# Patient Record
Sex: Female | Born: 1965
Health system: Southern US, Community
[De-identification: ages and names within clinical notes are randomized; demographics above are authoritative.]

## PROBLEM LIST (undated history)

## (undated) DIAGNOSIS — M1990 Unspecified osteoarthritis, unspecified site: Secondary | ICD-10-CM

## (undated) DIAGNOSIS — F329 Major depressive disorder, single episode, unspecified: Secondary | ICD-10-CM

## (undated) DIAGNOSIS — F32A Depression, unspecified: Secondary | ICD-10-CM

## (undated) HISTORY — DX: Major depressive disorder, single episode, unspecified: F32.9

## (undated) HISTORY — DX: Depression, unspecified: F32.A

## (undated) HISTORY — PX: OOPHORECTOMY: SHX86

## (undated) HISTORY — DX: Unspecified osteoarthritis, unspecified site: M19.90

---

## 1984-12-28 HISTORY — PX: APPENDECTOMY: SHX54

## 1988-12-28 HISTORY — PX: SEPTOPLASTY: SUR1290

## 1995-12-29 HISTORY — PX: CARPAL TUNNEL RELEASE: SHX101

## 1999-05-06 ENCOUNTER — Other Ambulatory Visit: Admission: RE | Admit: 1999-05-06 | Discharge: 1999-05-06 | Payer: Self-pay | Admitting: Gynecology

## 2000-11-08 ENCOUNTER — Other Ambulatory Visit: Admission: RE | Admit: 2000-11-08 | Discharge: 2000-11-08 | Payer: Self-pay | Admitting: Gynecology

## 2001-07-14 ENCOUNTER — Other Ambulatory Visit: Admission: RE | Admit: 2001-07-14 | Discharge: 2001-07-14 | Payer: Self-pay | Admitting: *Deleted

## 2002-01-26 ENCOUNTER — Inpatient Hospital Stay (HOSPITAL_COMMUNITY): Admission: AD | Admit: 2002-01-26 | Discharge: 2002-01-28 | Payer: Self-pay | Admitting: *Deleted

## 2002-03-14 ENCOUNTER — Other Ambulatory Visit: Admission: RE | Admit: 2002-03-14 | Discharge: 2002-03-14 | Payer: Self-pay | Admitting: *Deleted

## 2003-04-20 ENCOUNTER — Other Ambulatory Visit: Admission: RE | Admit: 2003-04-20 | Discharge: 2003-04-20 | Payer: Self-pay | Admitting: Gynecology

## 2004-03-03 ENCOUNTER — Ambulatory Visit (HOSPITAL_COMMUNITY): Admission: RE | Admit: 2004-03-03 | Discharge: 2004-03-03 | Payer: Self-pay | Admitting: Gastroenterology

## 2004-09-04 ENCOUNTER — Other Ambulatory Visit: Admission: RE | Admit: 2004-09-04 | Discharge: 2004-09-04 | Payer: Self-pay | Admitting: Gynecology

## 2004-12-28 HISTORY — PX: CHOLECYSTECTOMY: SHX55

## 2005-02-17 ENCOUNTER — Emergency Department (HOSPITAL_COMMUNITY): Admission: EM | Admit: 2005-02-17 | Discharge: 2005-02-18 | Payer: Self-pay | Admitting: Emergency Medicine

## 2005-02-21 ENCOUNTER — Observation Stay (HOSPITAL_COMMUNITY): Admission: RE | Admit: 2005-02-21 | Discharge: 2005-02-22 | Payer: Self-pay | Admitting: *Deleted

## 2005-02-22 ENCOUNTER — Encounter (INDEPENDENT_AMBULATORY_CARE_PROVIDER_SITE_OTHER): Payer: Self-pay | Admitting: Specialist

## 2005-10-12 ENCOUNTER — Other Ambulatory Visit: Admission: RE | Admit: 2005-10-12 | Discharge: 2005-10-12 | Payer: Self-pay | Admitting: Gynecology

## 2005-10-28 ENCOUNTER — Encounter (INDEPENDENT_AMBULATORY_CARE_PROVIDER_SITE_OTHER): Payer: Self-pay | Admitting: *Deleted

## 2005-10-28 ENCOUNTER — Ambulatory Visit (HOSPITAL_BASED_OUTPATIENT_CLINIC_OR_DEPARTMENT_OTHER): Admission: RE | Admit: 2005-10-28 | Discharge: 2005-10-28 | Payer: Self-pay | Admitting: Gynecology

## 2005-10-28 HISTORY — PX: OTHER SURGICAL HISTORY: SHX169

## 2005-10-28 HISTORY — PX: TUBAL LIGATION: SHX77

## 2005-12-28 HISTORY — PX: ABDOMINAL HYSTERECTOMY: SHX81

## 2005-12-28 HISTORY — PX: OTHER SURGICAL HISTORY: SHX169

## 2006-06-17 ENCOUNTER — Ambulatory Visit (HOSPITAL_COMMUNITY): Admission: RE | Admit: 2006-06-17 | Discharge: 2006-06-17 | Payer: Self-pay | Admitting: Gynecology

## 2006-06-22 ENCOUNTER — Ambulatory Visit: Admission: RE | Admit: 2006-06-22 | Discharge: 2006-06-22 | Payer: Self-pay | Admitting: Gynecologic Oncology

## 2006-06-22 ENCOUNTER — Other Ambulatory Visit: Admission: RE | Admit: 2006-06-22 | Discharge: 2006-06-22 | Payer: Self-pay | Admitting: Gynecology

## 2006-07-06 ENCOUNTER — Inpatient Hospital Stay (HOSPITAL_COMMUNITY): Admission: RE | Admit: 2006-07-06 | Discharge: 2006-07-08 | Payer: Self-pay | Admitting: Gynecology

## 2006-07-06 ENCOUNTER — Encounter (INDEPENDENT_AMBULATORY_CARE_PROVIDER_SITE_OTHER): Payer: Self-pay | Admitting: *Deleted

## 2007-08-11 ENCOUNTER — Other Ambulatory Visit: Admission: RE | Admit: 2007-08-11 | Discharge: 2007-08-11 | Payer: Self-pay | Admitting: Gynecology

## 2008-08-13 ENCOUNTER — Other Ambulatory Visit: Admission: RE | Admit: 2008-08-13 | Discharge: 2008-08-13 | Payer: Self-pay | Admitting: Gynecology

## 2008-08-31 ENCOUNTER — Ambulatory Visit: Payer: Self-pay | Admitting: Emergency Medicine

## 2008-08-31 ENCOUNTER — Emergency Department (HOSPITAL_COMMUNITY): Admission: EM | Admit: 2008-08-31 | Discharge: 2008-08-31 | Payer: Self-pay | Admitting: Emergency Medicine

## 2009-08-29 ENCOUNTER — Encounter: Payer: Self-pay | Admitting: Women's Health

## 2009-08-29 ENCOUNTER — Ambulatory Visit: Payer: Self-pay | Admitting: Women's Health

## 2009-08-29 ENCOUNTER — Other Ambulatory Visit: Admission: RE | Admit: 2009-08-29 | Discharge: 2009-08-29 | Payer: Self-pay | Admitting: Gynecology

## 2010-03-10 ENCOUNTER — Encounter: Admission: RE | Admit: 2010-03-10 | Discharge: 2010-03-10 | Payer: Self-pay | Admitting: Family Medicine

## 2010-09-04 ENCOUNTER — Other Ambulatory Visit: Admission: RE | Admit: 2010-09-04 | Discharge: 2010-09-04 | Payer: Self-pay | Admitting: Gynecology

## 2010-09-04 ENCOUNTER — Ambulatory Visit: Payer: Self-pay | Admitting: Women's Health

## 2011-05-12 NOTE — Op Note (Signed)
NAMEZEINAB, RODWELL               ACCOUNT NO.:  [id number]   MEDICAL RECORD NO.:  [id number]          PATIENT TYPE:  EMS   LOCATION:  MAJO                         FACILITY:  [facility]   PHYSICIAN:  Leslye Peer, MD    DATE OF BIRTH:  Jan 18, 1966   DATE OF PROCEDURE:  08/31/2008  DATE OF DISCHARGE:  08/31/2008                               OPERATIVE REPORT   PROCEDURE:  Fiberoptic bronchoscopy with foreign body retrieval.   OPERATOR:  Leslye Peer, MD   INDICATION:  Aspiration of pill.   Consent was obtained from the patient and a signed copy is in her  hospital chart.   MEDICATIONS GIVEN:  1. Fentanyl 100 mcg IV in divided doses.  2. Versed 4 mg IV in divided doses.  3. Lidocaine 1% to the bronchoalveolar tree approximately 15 mL total.   PROCEDURE DETAILS:  After informed consent was obtained, conscious  sedation was initiated as outlined above.  The bronchoscope was  introduced to the left nare without difficulty.  The upper airway and  posterior pharynx were normal in appearance.  There was no foreign body  noted.  Her cords moved normally with inspiration and phonation.  Trachea was intubated and local anesthesia was achieved with 1%  lidocaine.  The trachea was normal in appearance.  The main carina was  sharp.  The entire left-sided exam was normal without any evidence of  foreign body or abnormal secretions or erythema.  The left upper lobe  lingular and left lower lobe bronchi were all inspected.  Attention was  then turned to the right side exam, a large white friable pill was noted  in the bronchus intermedius.  The right upper lobe bronchus was clear.  The pill on the bronchus intermedius was easily movable with suctioning.  I was able to retrieve it by simple suctioning after 2-3 trials.  It was  retrieved all the way into the patient's mouth and then suctioned away.  Inspection of the bronchus intermedius as well as the right middle lobe  and lower lobe bronchi  showed no further evidence of foreign body  material.  There was significant erythema and inflammation present.   SPECIMENS:  None.   COMPLICATIONS:  None.   ESTIMATED BLOOD LOSS:  None.   PLANS:  I do not believe that Ms. Kernodle needs prophylactic antibiotics,  but I have asked her to call me if she develops any fevers, chills,  purulent sputum, or other bothersome symptoms.  I will give her a  prescription for Robitussin A-C for cough suppression.  She can follow  up with me on an as-needed basis.      Leslye Peer, MD  Electronically Signed     RSB/MEDQ  D:  08/31/2008  T:  09/01/2008  Job:  [id number]

## 2011-05-12 NOTE — Consult Note (Signed)
[name], PANGILINAN               ACCOUNT NO.:  [id number]   MEDICAL RECORD NO.:  [id number]          PATIENT TYPE:  EMS   LOCATION:  MAJO                         FACILITY:  [facility]   PHYSICIAN:  Leslye Peer, MD    DATE OF BIRTH:  [birth year]   DATE OF CONSULTATION:  08/31/2008  DATE OF DISCHARGE:  08/31/2008                                 CONSULTATION   CHIEF COMPLAINT:  Aspiration of pills.   HISTORY:  Angie [name] is Angie very pleasant 45 year old [name] without any  significant past medical history.  She has never smoked.  She takes  Zoloft for depression.  She states that she was well until this morning  when she got choked while she was taking 3 of her vitamins that included  vitamin D and calcium with vitamin D.  She could not move air and her  husband performed an abdominal thrust which dislodged the pills, so that  she could breathe again.  She believes that she swallowed at least some  of the material successfully, but she also feels Angie foreign body  sensation.  She has had Angie paroxysmal cough with focal right upper chest  burning pain since this episode.  Her chest x-ray in the emergency  department shows no evident foreign body.  Pulmonary was asked to  evaluate her in the emergency department.   PAST MEDICAL HISTORY:  Depression and there is no history of asthma as Angie  child.   MEDICATIONS:  Zoloft 100 mg daily.   ALLERGIES:  No known drug allergies.   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  She is Angie never smoker.  She does not use alcohol.  She denies any significant breathing exposures.  She is able to exercise  and exert herself without any difficulty at baseline.   FAMILY HISTORY:  Noncontributory.   PHYSICAL EXAMINATION:  VITAL SIGNS:  Temperature 98.5, blood pressure  122/75, heart rate 77, respiratory rate 16, and SPO2 96% on room air.  GENERAL:  This is Angie pleasant [name], slightly overweight who is  comfortable on oxygen but she is coughing frequently.  HEENT:  The oropharynx is clear.  I did not see Angie foreign body in her  mouth.  There is no stridor.  LUNGS:  Clear bilaterally.  She does cough on any deep inspiration.  HEART:  Regular without murmur.  ABDOMEN:  Benign.  EXTREMITIES:  No cyanosis, clubbing, or edema.  NEUROLOGIC:  He is alert and oriented x3 with Angie nonfocal exam.   Chest x-ray is clear as indicated above.   LABORATORY DATA:  None.   IMPRESSION:  Angie [name] status post aspiration of  pills with Angie possible residual foreign body.  Angie CT scan of her chest has  been ordered.  I believe that she likely needs fiberoptic bronchoscopy  whether an  endobronchial foreign body is seen on her CT or not.  I will try to  arrange for bronchoscopy to be performed in the endoscopy suite, but  will consider doing this in the  emergency department if the space is not  available.  I will consider discontinuation of her CT scan of the chest  in order to facilitate bronchoscopy if this is necessary.      Leslye Peer, MD  Electronically Signed     RSB/MEDQ  D:  08/31/2008  T:  09/01/2008  Job:  [id number]

## 2011-05-15 NOTE — H&P (Signed)
Angie [name], Angie [name]               ACCOUNT NO.:  [id number]   MEDICAL RECORD NO.:  [id number]          PATIENT TYPE:  AMB   LOCATION:  NESC                         FACILITY:  [facility]   PHYSICIAN:  Juan H. Lily Peer, M.D.DATE OF BIRTH:  07-29-1966   DATE OF ADMISSION:  DATE OF DISCHARGE:                                HISTORY & PHYSICAL   CHIEF COMPLAINT:  1.  Menometrorrhagia.  2.  Endometrial polyp.  3.  Request for elective permanent sterilization.   HISTORY:  The patient is a 45 year old gravida 2 para 1 AB 1 who has been  evaluated at [facility] for menometrorrhagia. The patient has  been having two periods per month over the past 3-4 months. She underwent an  endometrial biopsy on September 22, 2005, which demonstrated secretory  endometrium, no hyperplasia or malignancy identified. She underwent a  sonohysterogram which demonstrated a left anterior wall defect measuring 11  x 16 x 14 mm consistent with endometrial polyp. Her recent Pap smear also  had been normal, as well as her CBC, urinalysis, Pap smear, and  comprehensive metabolic panel. The patient also had requested to proceed  with tubal sterilization procedure for which the patient has been counseled  for both.   PAST MEDICAL HISTORY:  The patient denies any allergies. She has history of  depression for which she takes Effexor 150 mg daily. She had been on Megace  20 mg two weeks before her planned surgery. She also takes multivitamin,  Caltrate + D twice a day, baby aspirin daily.   PREVIOUS SURGERIES:  Consist of appendectomy in 1986, in 1990 she had a  septoplasty, in 1997 she had carpal tunnel release, and cholecystectomy in  February 2006.   FAMILY HISTORY:  History of hypertension in maternal grandmother, as well as  family history of hypercholesterolemia and father with insulin-dependent  diabetes.   PHYSICAL EXAMINATION:  VITAL SIGNS:  The patient weighs 217 pounds. She is 5  feet 6-and-a-half  inches tall. Blood pressure 112/60.  HEENT:  Unremarkable.  NECK:  Supple, trachea midline. No carotid bruits, no thyromegaly.  LUNGS:  Clear to auscultation without rhonchi or wheezes.  HEART:  Regular rate and rhythm. No murmurs or gallop.  BREAST:  Exam done at the time of her annual exam on October 16 was normal.  PELVIC:  Bartholin, urethra, Skene glands within normal limits. Vagina and  cervix with no lesion or discharge. Uterus anteverted, normal size, shape,  and consistency. Adnexa without masses or tenderness.  RECTAL:  Deferred.   ASSESSMENT:  A 45 year old gravida 2 para 1 abortus 1 with menometrorrhagia  consisting of two periods per month over the past 3-4 months. Evaluation has  consisted of a benign endometrial biopsy as well as a sonohysterogram  demonstrating endometrium polyp. The patient requesting also at the time of  resectoscopic polypectomy to have a laparoscopic tubal sterilization  procedure. The patient is fully aware the procedure is permanent, she will  not be able to have any more children. Potential risks have been outlined in  the preoperative consultation in the office to include trauma to  internal  organs with a laparoscope requiring open laparotomy or in the event of  technical difficulty, open laparotomy which will require the patient to be  in the hospital an additional few days. Also, trauma to internal organs  resulting in hemorrhage and the potential for transfusion or blood products  with potential risk of anaphylactic reactions, hepatitis, and AIDS were  discussed. Also, increased risk for deep venous thrombosis and pulmonary  embolism and death were also discussed. Also the risk of perforation to  uterus was also discussed. All these issues were discussed with the patient,  all questions are answered, and will follow accordingly.   PLAN:  The patient is scheduled for (1) resectoscopic polypectomy, (2)  laparoscopic tubal sterilization procedure  at [facility] on  Wednesday, October 28, 2005, at 1 p.m.      Juan H. Lily Peer, M.D.  Electronically Signed     JHF/MEDQ  D:  10/28/2005  T:  10/28/2005  Job:  [id number]

## 2011-05-15 NOTE — Op Note (Signed)
Angie [name], Angie [name]               ACCOUNT NO.:  [id number]   MEDICAL RECORD NO.:  [id number]          PATIENT TYPE:  INP   LOCATION:  0006                         FACILITY:  [facility]   PHYSICIAN:  Juan H. Lily Peer, M.D.DATE OF BIRTH:  06-08-1966   DATE OF PROCEDURE:  07/06/2006  DATE OF DISCHARGE:                                 OPERATIVE REPORT   SURGEON:  Juan H. Lily Peer, M.D.; Patience Musca, M.D.   INDICATIONS FOR OPERATION:  The patient is a 45 year old gravida 4, para 1,  AB 3 with right pelvic mass and pain.   PREOPERATIVE DIAGNOSIS:  Symptomatic pelvic mass.   POSTOPERATIVE DIAGNOSIS:  Right endometriotic cyst.   ANESTHESIA:  General endotracheal anesthesia.   PROCEDURE PERFORMED:  1.  Exploratory laparotomy.  2.  Pelvic adhesiolysis.  3.  Total abdominal hysterectomy with right salpingo-oophorectomy.   FINDINGS:  A 6 x 7 cm right ovarian mass encroached in the cul-de-sac  encased in adhesions, left tube and ovary were normal.  There was evidence  of previous tubal sterilization procedure, whereby Hulka clips were noted in  the proximal one third portion of both fallopian tubes.  Uterine serosa was  normal.  There was no excrescences on the surface of the right ovary and  left ovary appeared normal.  There was no peritoneal implants or any seeding  of the peritoneum on inspection.   DESCRIPTION OF OPERATION:  After the patient was adequately counseled, she  was taken to the operating room where she underwent successful general  endotracheal anesthesia.  She had received a gram of cefoxitin for  prophylaxis.  She had PSA stockings for DVT prophylaxis.  She was placed in  low lithotomy position after Foley catheter was placed, the abdomen was  prepped and draped in usual sterile fashion.  A midline incision was made  from the subumbilical region down to the level of the symphysis pubis.  The  incision was carried down through the skin, subcutaneous tissue down to the  rectus fascia whereby midline nick was made.  The fascia was incised in a  vertical fashion into the peritoneal cavity.  Bookwalter retractors were  placed.  The patient then placed in Trendelenburg position.  Pelvic washings  were obtained and a meticulous dissection was required to free the right  adnexal mass from the posterior serosa of the uterus and the pelvic sidewall  requiring dissection to free the mass from the right ureter.  Once this was  accomplished, the bladder flap was established.  The remaining broad and  cardinal ligaments were serially clamped, cut and suture ligated with 0  Vicryl suture to the level of the right vaginal fornix.  Of note, the right  infundibulopelvic ligament had been secured with 2-0 Vicryl suture x2 after  it was transected.  The left ovary was to be left behind so the Heaney  clamps were placed close to the uterus grasping the utero-ovarian ligament  as well and it was transected and the pedicle was secured with 0 Vicryl  suture x2.  The remainder of the broad cardinal ligaments were serially  clamped,  cut and suture ligated with 0 Vicryl suture to the level of left  vaginal fornix in an effort to have exposure to the lower uterine segment.  The uterus was amputated and passed off the operative field with the right  tube and ovary so a frozen could be made while the rest of the operation was  completed.  The remaining cervical stump was excised from the vagina.  The  angles were secured with 0 Vicryl suture and transfixation fashion and the  remainder of the cuff was secured with interrupted sutures of 0 Vicryl  suture.  The pathology report preliminary verbal confirmed that the mass was  an endometriotic cyst.  The pelvic cavity was copiously irrigated with  normal saline solution.  Indigo carmine was administered in an effort to  reassure that there was no injury to the ureter or bladder and also 150 mL  of milk was instilled into the bladder and  it was intact.  Once this was  completed, sponge and needle count was correct.  Pelvic cavity was copiously  irrigated with normal saline solution and stress closure was started. The  Bookwalter retractors were removed.  The visceral peritoneum was not  reapproximated but the rectus fascia was closed with a running stitch of 1-0  Prolene in a running stitch fashion.  The subcutaneous bleeders were Bovie  cauterized.  The skin was reapproximated with skin clips followed by  placement Xeroform gauze and 4 x 8 dressing.  The patient was extubated and  transferred to recovery room with stable vital signs.  Estimated blood loss  was 125 mL.  Urine output 100 mL and IV fluids 4300 mL of lactated Ringer's.      Juan H. Lily Peer, M.D.  Electronically Signed     JHF/MEDQ  D:  07/06/2006  T:  07/06/2006  Job:  [id number]

## 2011-05-15 NOTE — Discharge Summary (Signed)
[name], [name]               ACCOUNT NO.:  [id number]   MEDICAL RECORD NO.:  [id number]          PATIENT TYPE:  INP   LOCATION:  1615                         FACILITY:  [facility]   PHYSICIAN:  Juan H. Lily Peer, M.D.DATE OF BIRTH:  [birth year]   DATE OF ADMISSION:  07/06/2006  DATE OF DISCHARGE:  07/08/2006                                 DISCHARGE SUMMARY   FINAL DIAGNOSES:  1.  Pelvic mass (right endometriotic cyst).  2.  Endometrial polyp.  3.  Chronic cervicitis.   PROCEDURE PERFORMED:  1.  Exploratory laparotomy.  2.  Lysis of pelvic adhesions.  3.  Total abdominal hysterectomy with right salpingo-oophorectomy.   TOTAL DAYS HOSPITALIZED:  Two.   HISTORY:  The patient is a 45 year old, gravida 4, para 1, AB 3 with a right  adnexal mass, was taken to the operating room on July 06, 2006, where she  underwent exploratory laparotomy, pelvic adhesiolysis, and total abdominal  hysterectomy with right salpingo-oophorectomy.  The surgeons were Lajuan Lines, MD, and Cleda Mccreedy, MD.  Frozen specimen submitted during the  operation demonstrated that it was a benign endometriotic cyst and on final  pathology report confirmed the benign endometriotic cyst, which measured 7.5  cm, but the endometrium was proliferative with a benign endometrial polyp,  serosa with some endometriosis, the right fallopian tube was benign, the  cervix with chronic cervicitis, but no dysplasia identified, and pelvic  washings were negative.  The patient did well intraoperatively and had blood  loss of 125 ml, the urine output was 100 ml, and she received 43 ml of  lactated Ringer.  Her first postoperative day, her T-max was 98.2, her  hemoglobin was 10.1, she was afebrile, her Foley catheter had been removed  earlier that morning and had good urine output.  The patient was started on  a liquid diet later that day.  On the second postoperative day, the patient  was normotensive, was voiding  spontaneously, her lungs were clear to  auscultation, no rhonchi or wheezes, she had bowel sounds present, she  tolerated her liquid diet and was advanced to a regular diet.  In the  evening of July the 12th on evening rounds, she had been up and ambulating,  tolerating a regular diet well, had passed flatus and had a loose stool, her  bowel sounds were present, her incision was intact, her abdomen was soft  with no rebound or guarding.  The patient was anxious to be discharged home.   FINAL DISPOSITION AND FOLLOWUP:  The patient was ready to be discharged home  on her second postoperative day.  She was up and ambulating, tolerating a  regular diet well, and had passed flatus and had a loose  stool.  She was discharged on Darvocet one p.o. q.4-6h p.r.n. pain as well  as Reglan 10 mg to take one p.o. q.4-6h p.r.n. nausea, and she will return  to the office next week to have her staples removed.  Discharge instructions  were provided as well.      Juan H. Lily Peer, M.D.  Electronically Signed  JHF/MEDQ  D:  07/08/2006  T:  07/08/2006  Job:  [id number]

## 2011-05-15 NOTE — Op Note (Signed)
Angie [name], Angie [name]               ACCOUNT NO.:  [id number]   MEDICAL RECORD NO.:  [id number]          PATIENT TYPE:  AMB   LOCATION:  NESC                         FACILITY:  [facility]   PHYSICIAN:  Juan H. Lily Peer, M.D.DATE OF BIRTH:  [birth year]   DATE OF PROCEDURE:  10/28/2005  DATE OF DISCHARGE:                                 OPERATIVE REPORT   INDICATIONS FOR OPERATION:  A 45 year old, gravida 2, para 1, AB 1 with  menometrorrhagia. A workup consisting of sonohysterogram demonstrated  endometrial polyps, benign endometrial biopsy. The patient also requesting  elective permanent sterilization.   PREOPERATIVE DIAGNOSES:  1.  Dysfunctional uterine bleeding.  2.  Endometrial polyp.  3.  Request for elective permanent sterilization.   POSTOPERATIVE DIAGNOSES:  1.  Dysfunctional uterine bleeding.  2.  Endometrial polyp.  3.  Request for elective permanent sterilization.   ANESTHESIA:  General endotracheal anesthesia.   PROCEDURES PERFORMED:  1.  Laparoscopic tubal sterilization procedure bilateral Filshie clip      technique.  2.  Resectoscopic polypectomy.   DESCRIPTION OF OPERATION:  After the patient was adequately counseled, she  was taken to the operating room where she underwent a successful general  endotracheal anesthesia. She had received a gram of cefotetan for  prophylaxis. After general endotracheal anesthesia was on board, her legs  were placed in the low lithotomy position and the abdomen, vagina and  perineum were prepped and draped in the usual sterile fashion. A red rubber  Roxan Hockey had been inserted in an effort to evacuate the bladder of  approximately 50 mL of urine. A Hulka tenaculum had been placed after  bimanual examination demonstrated anteverted uterus with no palpable adnexal  masses. A small stab incision was made underneath the umbilicus followed by  insertion of the Veress needle. Opening intra-abdominal pressure was 8 mmHg,  approximately 2 1/2  liters of carbon dioxide was insufflated into the  peritoneal cavity. The Veress needle was removed, the 10 mm trocar was  inserted. A second puncture site with a 5 mm trocar was made in the midline  approximately 2 cm above the symphysis pubis under laparoscopic guidance, a  systematic inspection of the entire pelvic cavity demonstrated a retroverted  uterus, no abnormality noted, normal fallopian tube, lush fimbriated end, no  endometriosis or adhesions on the anterior or posterior cul-de-sac. The only  finding was the right ovary was slightly adhered to the posterior surface of  the uterus. Attention was placed at the right fallopian tube which was  identified and placed under traction with self-retaining retractor and a  Filshie clip was placed in the proximal one-third portion. A similar  procedure was carried out on the contralateral side. Pre and post  sterilization pictures were obtained for the patient's record at the  hospital and one said to be kept in the patient's record at [facility] .  Once this  was completed, the instruments were removed, the carbon dioxide was removed.  The subumbilical fascia was closed with a pursestring suture of 3-0 Vicryl  suture and the skin was reapproximated with interrupted sutures of 4-0 plain  catgut suture. The 5 mm trocar site was reapproximated with interrupted  sutures of 4-0 plain catgut suture as well. For postoperative analgesia, the  patient received a 0.25% Marcaine for a total 10 mL. Attention was then  placed to the hysteroscopic portion, the legs were put in the high lithotomy  position, the Hulka tenaculum was removed. A single-tooth tenaculum was then  placed in the anterior cervical lip. Of note, the patient had a laminaria  that was removed prior to placement of the Hulka tenaculum before the  laparoscopic portion. The cervix required no dilatation and the operative  resectoscope with a 90 degree wire loop was inserted into the  intracervical  canal and into the uterine cavity after the uterus had sounded to 7 cm. Two  percent sorbitol was the distending media. Systematic inspection  demonstrated a normal cervical canal, both tubal ostia were identified. She  did have a polypoid lesion in the anterior left uterine wall and the rest of  the endometrium was lush. With the electrosurgical generator set at 80 watts  on a cutting at 80 watts in the coagulation mode, the polyp was resected and  passed the operative field. This was interchanged with a small serrated  curette to completely sample the endometrial cavity and was submitted for  histologic evaluation. The base of the polyp was cauterized with the  resectoscope. Pre and post resectoscopic pictures were obtained. The patient  tolerated the procedure well, the instruments were removed. The patient  received a total 1500 mL of lactated Ringer's. She was extubated,  transferred to recovery room with stable vital signs. She will receive  Toradol 30 mg IV in route to the recovery room. Blood loss was less than 50  mL and IV fluids consisted of 1500 mL of lactated Ringer's.      Juan H. Lily Peer, M.D.  Electronically Signed     JHF/MEDQ  D:  10/28/2005  T:  10/29/2005  Job:  [id number]

## 2011-05-15 NOTE — Discharge Summary (Signed)
[facility]  Patient:    Angie [name], Angie [name] [facility] Visit Number: [id number] MRN: [id number]          Service Type: OBS Location: 910A [id number] [id number] Attending Physician:  Wetzel Bjornstad Dictated by:   Antony Contras, [facility] Admit Date:  01/26/2002 Discharge Date: 01/28/2002                             Discharge Summary  DISCHARGE DIAGNOSIS:          Intrauterine pregnancy at 36 weeks,                               spontaneous rupture of membranes,                               occiput posterior position, asynclitic,                               uneffective pushing.  PROCEDURES:                   Lower outlet forceps assisted vaginal delivery                               of a viable infant with repair of left                               sulcus tear.  HISTORY OF PRESENT ILLNESS:   The patient is a 45 year old, Gravida 2, Para 0-0-1-0 with an LMP of May 16, 2001, [facility] February 18, 2001.  Prenatal course complicated by advanced maternal age.  The patient did have amniocentesis showing normal chromosomes, also depression for which she was on Effexor.  LABORATORY DATA:              Blood type A positive.  Antibody screen negative.  RPR, HBICG, HIV nonreactive.  GBS is positive.  HOSPITAL COURSE AND TREATMENT:                    The patient was admitted a 36 weeks with spontaneous rupture of membranes.  She did progress to complete dilatation. She was given beta Strep prophylaxis.  Delivery was accomplished low forceps secondary to OP position, asynclitive and reflective pushing.  She was delivered of an Apgar 8 and 9 infant weighing 7 pounds 4 ounces with repair of left sulcus tear.  POSTPARTUM COURSE:            The patient remained afebrile.  She had no difficulty voiding.  She was able to be discharged in satisfactory condition on her postpartum day.  CBC:  Hematocrit 30.2, hemoglobin 10.6, WBC 20.4, platelets 165.  DISPOSITION:                   Follow up in six weeks.  Continue prenatal vitamins and iron.  Motrin and Tylox for pain.  Continue with Effexor. Dictated by:   Antony Contras, [facility] Attending Physician:  Wetzel Bjornstad DD:  02/08/02 TD:  02/09/02 Job: [id number]

## 2011-05-15 NOTE — H&P (Signed)
[name], [name]               ACCOUNT NO.:  [id number]   MEDICAL RECORD NO.:  [id number]          PATIENT TYPE:  INP   LOCATION:  NA                           [facility]AC[facility]   PHYSICIAN:  Juan H. Lily Peer, M.D.DATE OF BIRTH:  Apr 10, 1966   DATE OF ADMISSION:  07/06/2006  DATE OF DISCHARGE:                                HISTORY & PHYSICAL   CHIEF COMPLAINT:  Symptomatic pelvic mass.   HISTORY:  The patient is a 45 year old, gravida 2, para 2, who over the past  months has complained of right lower abdominal pain and worsening  dysmenorrhea. An ultrasound had demonstrated a thick wall mass measuring 6.2  x 4.3 x 5.1 cm in the right ovary. Review of her record indicated that prior  ultrasound in September 2006 she had a thin wall cyst on the left ovary, but  none on her right.  Also of interest that she had a laparoscopic tubal  sterilization in November 2006 and there were no ovarian masses seen at that  time.  She subsequently had a CT of the pelvis on June 21st which once again  demonstrated a right hemipelvic mass identified and measured 5.9 x 7.8 x 6.5  cm.  Peripheral enhancements identified were suggestive of internal  septations, but there was no adenopathy, no intrapelvic fluid, or peritoneal  implant to suggest any extralesional spread of any potential disease. This  right adnexal mass with a mixed attenuation made it suspicious for a primary  ovarian cancer and for this reason GYN oncological consultation was obtained  with Dr. Rejeana Brock A. Duard Brady from [facility] GYN Oncology Service. The patient was seen  by Dr. Duard Brady in consultation on June 22, 2006, and my evaluation to that  point had been submitted for her evaluation as well as which consisted of  the following:  She had a normal Pap smear done on June 2007. Her CA125 on  June 03, 2006, was normal with a value of 24.4.  Her chest x-ray on June 21st  was normal and the patient's most recent endometrial biopsy on June 26th  revealed benign secretory endometrium with no hyperplasia or malignancy  identified.  Dr. Duard Brady concurred with the seating with an exploratory  laparotomy and total abdominal hysterectomy with right salpingo-oophorectomy  and left ovarian conservation unless frozen section demonstrates evidence of  malignancy on the right therefore requiring then removal of the left adnexa  as well, as well as the staging procedure.   PAST MEDICAL HISTORY:  She has sensitivity to VICODIN.  Her prior surgical  history consists of the following:  Resectoscopic surgery for an endometrial  polyp with a concurrent bilateral tubal ligation in November 2006, nasal  septoplasty, right carpal tunnel surgery, cholecystectomy in 2005, an open  appendectomy in 1982, an one spontaneous vaginal delivery.  Medical history  includes history of depression. She takes Zoloft 50 mg daily, Aleve p.r.n.,  and Darvocet p.r.n.   SOCIAL HISTORY:  She lives at home with her husband and her 37-year-old son.  Her husband is a Curator and she works as a Surveyor, mining  in a  doctor's office. She denies use of illicit drugs, alcohol, or tobacco.   The patient has not had a mammogram to this point.   PHYSICAL EXAMINATION:  VITAL SIGNS: The patient weighs 225 pounds, she is 5  feet 7 inches tall. Blood pressure 130/80, pulse 64, respirations 16.  GENERAL APPEARANCE: Well-developed, well-nourished female in no acute  distress.  HEENT:  Unremarkable.  NECK: Supple. Trachea midline. No carotid bruits. No thyromegaly.  LUNGS: Clear to auscultation without rales, rhonchi, or wheezes.  HEART:  Regular rate and rhythm with no murmurs or gallops.  ABDOMEN: Soft and nontender. No rebound or guarding.  No inguinal  lymphadenopathy.  EXTREMITIES: No cords, no edema.  PELVIC EXAM: Bartholin's urethra, Skene glands within normal limits. Vagina  and cervix with no gross lesions on inspection. The uterus is anteverted,  although limited  exam as to her adnexa due to the patient's abdominal girth  because of her weight, but there was a fullness in her right adnexa.  RECTAL EXAM: Not done.   ASSESSMENT:  A 45 year old gravida 2, para 1, abortus 2, with worsening  right lower quadrant pain over the past several months with evidence of a  complex right ovarian mass measuring 5.9 x 7.8 x 6.5 cm, normal CA-125,  normal chest x-ray, normal Pap smear, normal endometrial biopsy.  CT with no  evidence of any inguinal lymphadenopathy.  The patient was counseled as to  the risks, benefits, and pros and cons of the operation to include  infection, although she will receive prophylaxis antibiotic, the risks of  deep venous thrombosis for which she will PSA stockings for prevention. Also  in the event of uncontrollable hemorrhage that the patient were to need  blood or blood products she is fully aware of the potential risk such as  anaphylactic reaction, hepatitis, and AIDS. Also the risks of trauma to  internal organs such as bowel, bladder, nerve injury which may require  reparative surgery at that time. Also the risks for diverting colostomy in  the event that this ovarian mass is adhered to a segment of bowel which may  require bowel resection as well. Also in the event that she may need staging  procedure with removal of her reproductive organs for staging were discussed  with myself as well as with Dr. Duard Brady, the GYN oncologist, who will be the  co-surgeon in the operation. All these issues were discussed with Angie [name], all questions were answered, and we will follow accordingly.   PLAN:  The patient is scheduled for an exploratory laparotomy with total  abdominal hysterectomy and right salpingo-oophorectomy, possible staging  procedure on Tuesday, July 10th at [facility].      [place] H. Lily Peer, M.D.  Electronically Signed     JHF/MEDQ  D:  07/05/2006  T:  07/05/2006  Job:  [id number]

## 2011-05-15 NOTE — Consult Note (Signed)
NAMECRYSTIE, Angie [name]               ACCOUNT NO.:  [id number]   MEDICAL RECORD NO.:  [id number]          PATIENT TYPE:  OUT   LOCATION:  GYN                          FACILITY:  [facility]   PHYSICIAN:  Paola A. Duard Brady, MD    DATE OF BIRTH:  [birth year]   DATE OF CONSULTATION:  06/22/2006  DATE OF DISCHARGE:                                   CONSULTATION   Ms. Mccuistion is a very pleasant 45 year old, gravida 1, para 4, who has a 3-  year-old son, [name]. Her last cycle was May 28, 2006.  She states that she has  always had significant dysmenorrhea, however her cycle in April of 2007 was  worse than it had been in the past.  She was seen by the nurse practitioner  at Dr. Fontaine No office, it was felt that she most likely had a cyst with  that cycle, which was consistent with her history, and she was followed up  closely in May 2007.  She had increasing pain with her cycle and was  subsequently seen in the office.  Workup included an ultrasound, which  revealed a thick-walled mass measuring 6.2 x 4.3 x 5.1 cm in the right  ovary.  She had had a prior ultrasound done in September 2006 and she had a  thin wall cyst on the left ovary, none on the right.  She also had a  laparoscopic tubal sterilization in November 2006 and there were no ovarian  masses at that time.  The ultrasound followed up by CT scan of the abdomen  and pelvis which revealed a 5.9 x 7.8 x 6.5-cm mixed attenuation mass in the  right pelvis.  There is some peripheral enhancement identified with some  internal septations.  There was no adenopathy, intrapelvic fluid, or  peritoneal implants noted.  The terminal ileum courses and  is contiguous  with this.  There is no inguinal adenopathy.  The left ovary has a 2-cm  cystic area, which is probably a follicle.  The uterus is normal in  appearance.  She had a CA-125 drawn which is 24.4.  She states her pain has  changed somewhat.  She did have a cycle of May 28, 2006, and since that  time  the pain has increased.  Initially the pain was a stabbing discomfort in the  right lower quadrant and now it radiates over to the left.  She has also  experienced some occasional right flank pain and right anterior thigh pain.  She did use some Demerol that she had available from a cholecystectomy in  2005 and it relieved her pain.  She is taking Aleve which helps during the  day, but at night she is having increasing discomfort.  She was given a  prescription for Darvocet by Dr. Lily Peer.  She denies any significant  change in bowel or bladder habits.  She does complain of some occasional  pressure, occasional diarrhea but that has been stable since her  cholecystectomy.  She has about two to three stools per day.  In November  2006 she had an endometrial  biopsy that was negative.  She had a repeat  biopsy done today as well as a Pap smear.   PAST SURGICAL HISTORY:  1.  Cholecystectomy in 2005.  2.  Hysteroscopy and BTL in November 2006.  3.  Right carpal tunnel surgery.  4.  Nasal septoplasty.  5.  Open appendectomy in 1982.  6.  SVD times one.   PAST MEDICAL HISTORY:  Depression.   MEDICATIONS:  1.  Darvocet p.r.n.  2.  Aleve p.r.n.  3.  Zoloft 50 mg daily.   ALLERGIES:  None, but she does have a sensitivity to VICODIN which causes  her heart to race.   SOCIAL HISTORY:  She denies use of tobacco or alcohol.  She is married.  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  Her husband is  a Curator.  She lives at home with her husband and her 53-year-old son, [name].   FAMILY HISTORY:  She is estranged from her biological father, but her  biologic father she knows had insulin-dependent diabetes.  On her maternal  side, there is coronary disease and strokes, but no cancer.   HEALTH MAINTENANCE:  She has never had a mammogram.   PHYSICAL EXAMINATION:  VITAL SIGNS: Height 5 feet 7 inches, weight to 225  pounds, blood pressure 130/80, pulse 64,  respirations 16.  GENERAL: Well-nourished, well-developed female, in no acute distress.  NECK: Supple. There is no lymphadenopathy, no thyromegaly.  LUNGS: Clear to auscultation bilaterally.  CARDIOVASCULAR:  Regular rate and rhythm.  ABDOMEN: Soft, nontender, nondistended. There are no palpable masses or  hepatosplenomegaly. The exam is limited by habitus.  Groins are negative for  adenopathy  EXTREMITIES: There is no edema.  PELVIC:  Bimanual examination reveals the cervix is palpably normal.  The  corpus does not appear appreciably enlarged, but exam is limited somewhat by  habitus.  There is a fullness in the right lower quadrant, thought a  distinct mass is not palpated.  Exam is limited by habitus.   ASSESSMENT:  A 45 year old with a complex right adnexal mass, normal CA-125.  I think that the likelihood of this being a malignant process is low.  I did  discussed with the patient as Dr. Lily Peer had discussed with her that we  will proceed with exploratory laparotomy, TAH/RSO, the right ovary would be  sent for frozen section and if  benign she could retain her left ovary; if a  malignant process the contralateral ovary would need to be removed with  appropriate staging.  Risks and benefits of surgery were discussed with the  patient, and her questions were elicited and then answered to her  satisfaction.  She is scheduled for surgery on July 06, 2006, with Dr.  Lily Peer and myself.  She was given my card for any questions prior to that  time.  She is aware that I will not be in [place] until that day.  She  also had the preoperative procedures discussed with her by Telford Nab.      Paola A. Duard Brady, MD  Electronically Signed     PAG/MEDQ  D:  06/22/2006  T:  06/22/2006  Job:  [id number]   cc:   Gaetano Hawthorne. Lily Peer, M.D.  Fax: [phone]

## 2011-05-15 NOTE — Op Note (Signed)
Angie [name], Angie [name]               ACCOUNT NO.:  [id number]   MEDICAL RECORD NO.:  [id number]          PATIENT TYPE:  OBV   LOCATION:  0472                         FACILITY[facility]  [facility]   PHYSICIAN:  Vikki Ports, MDDATE OF BIRTH:  [birth year]   DATE OF PROCEDURE:  02/21/2005  DATE OF DISCHARGE:                                 OPERATIVE REPORT   PREOPERATIVE DIAGNOSIS:  Cholecystitis.   POSTOPERATIVE DIAGNOSIS:  Cholecystitis.   PROCEDURE:  Laparoscopic cholecystectomy and intraoperative cholangiogram.   SURGEON:  Vikki Ports, MD   ASSISTANT:  Anselm Pancoast. Zachery Dakins, M.D.   ANESTHESIA:  General.   DESCRIPTION OF PROCEDURE:  Patient was taken to the operating room and  placed in a supine position after adequate general anesthesia was induced  using an endotracheal tube.  The abdomen was prepped and draped in the  normal sterile fashion.  Using a transverse infraumbilical incision, I  dissected down to the fascia.  The fascia was opened vertically.  An 0  Vicryl purse-string suture was placed around the fascial defect.  The Hasson  trocar was placed in the abdomen, and pneumoperitoneum was obtained.  Under  direct visualization, a 10 mm port was placed in the subxiphoid region, and  two 5 mm ports were placed in the right abdomen.  The gallbladder was  identified and retracted cephalad.  There was a number of adhesions to the  side wall of the gallbladder, which were taken down.  The infundibulum was  identified, as was the cystic duct.  It was clipped proximally, and a  ductotomy was made.  Cholangiogram was performed, which showed no filling  defects, normal flow into the duodenum, and normal right and left hepatic  ducts.  The cystic artery was then dissected, triply clipped and divided.  The gallbladder was taken off the gallbladder bed using Bovie electrocautery  and removed through the umbilical port.  The right upper quadrant was  copiously irrigated.  Adequate  hemostasis was assured.  Pneumoperitoneum was  released.  The infraumbilical fascial defect was closed with the 0 Vicryl  purse-string suture and an additional figure-of-eight 0 Vicryl.  Skin  incisions were closed with subcuticular 4-0 Monocryl.  Steri-Strips and  sterile dressings were applied.  The patient tolerated the procedure well  and went to the PACU in good condition.      KRH/MEDQ  D:  02/21/2005  T:  02/21/2005  Job:  [id number]

## 2011-05-15 NOTE — H&P (Signed)
[facility]  Patient:    Angie [name], Angie [name] Visit Number: [id number] MRN: [id number]          Service Type: Attending:  Katy Fitch, M.D. Dictated by:   Katy Fitch, M.D. Adm. Date:  01/26/02                           History and Physical  CHIEF COMPLAINT:              Ruptured membranes.  HISTORY OF PRESENT ILLNESS:   The patient is a 45 year old G2, P0, AB1, at 36-6/7, who presents to [facility] complaining of ruptured membranes. The patient was noted to have ruptured membranes at home with no evidence of bleeding or decreased fetal movement.  The patient was checked in triage and noted to have positive fern and nitrazine and grossly ruptured, with a reactive fetal tracing and the cervix noted to be 2-3 cm dilated, contracting every three to four minutes.  The patients prenatal course was complicated by advanced maternal age, which she had amniocentesis showing the normal genetic 46XY.  PAST MEDICAL HISTORY:         Significant for depression.  PAST SURGICAL HISTORY:        Appendectomy, carpal tunnel, septoplasty.  ALLERGIES:                    None.  MEDICATIONS:                  Prenatal vitamins.  SOCIAL HISTORY:               The patient works as a Building services engineer. for orthopedics. Denies any tobacco, alcohol, or drugs.  FAMILY HISTORY:               Denied mental retardation or genetic diseases.  PHYSICAL EXAMINATION:  VITAL SIGNS:                  Blood pressure 112/70.  HEENT:                        Throat clear.  CHEST:                        Lungs clear to auscultation bilaterally.  CARDIAC:                      Regular rate and rhythm.  ABDOMEN:                      Gravid, nontender.  Estimated fetal weight 7 pounds even.  EXTREMITIES:                  Without edema, clubbing, cyanosis, or tenderness.  PELVIC:                       Cervix 2-3, 90%, -1.  Tocometer q.3.  Fetal heart tones 130s, reactive.  PLAN:                          Admit to labor and delivery.  Will start penicillin for prophylaxis for positive GBS and being preterm.  Will give adequate pain relief with epidural when patient desires.  Will monitor labor curve closely. Dictated by:   Katy Fitch, M.D. Attending:  Katy Fitch, M.D. DD:  01/26/02  TD:  01/26/02 Job: [id number]

## 2011-08-30 IMAGING — CR DG CHEST 2V
2 series · 2 of 2 positions shown · non-contrast
Comparison: Chest x-ray of 08/31/2008

CLINICAL DATA: Positive PPD

CHEST - 2 VIEW

[view not recorded (1 of 2)]
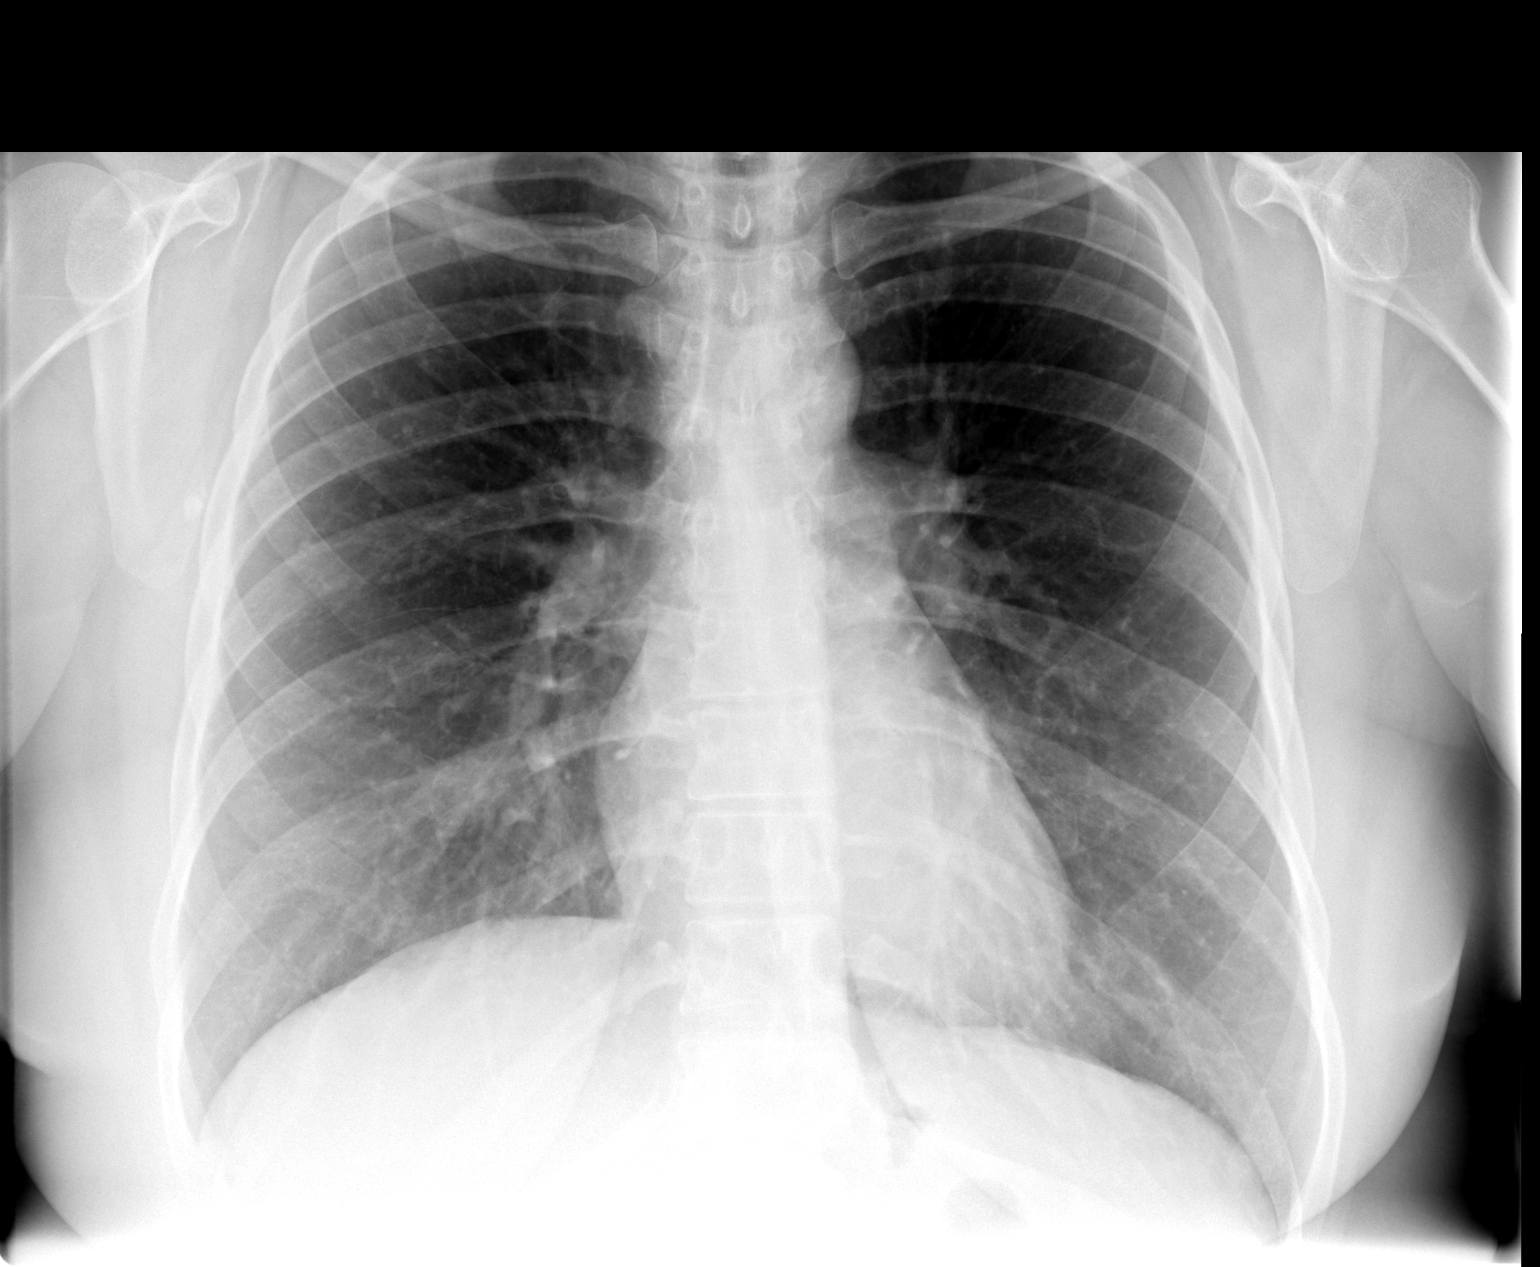

[view not recorded (2 of 2)]
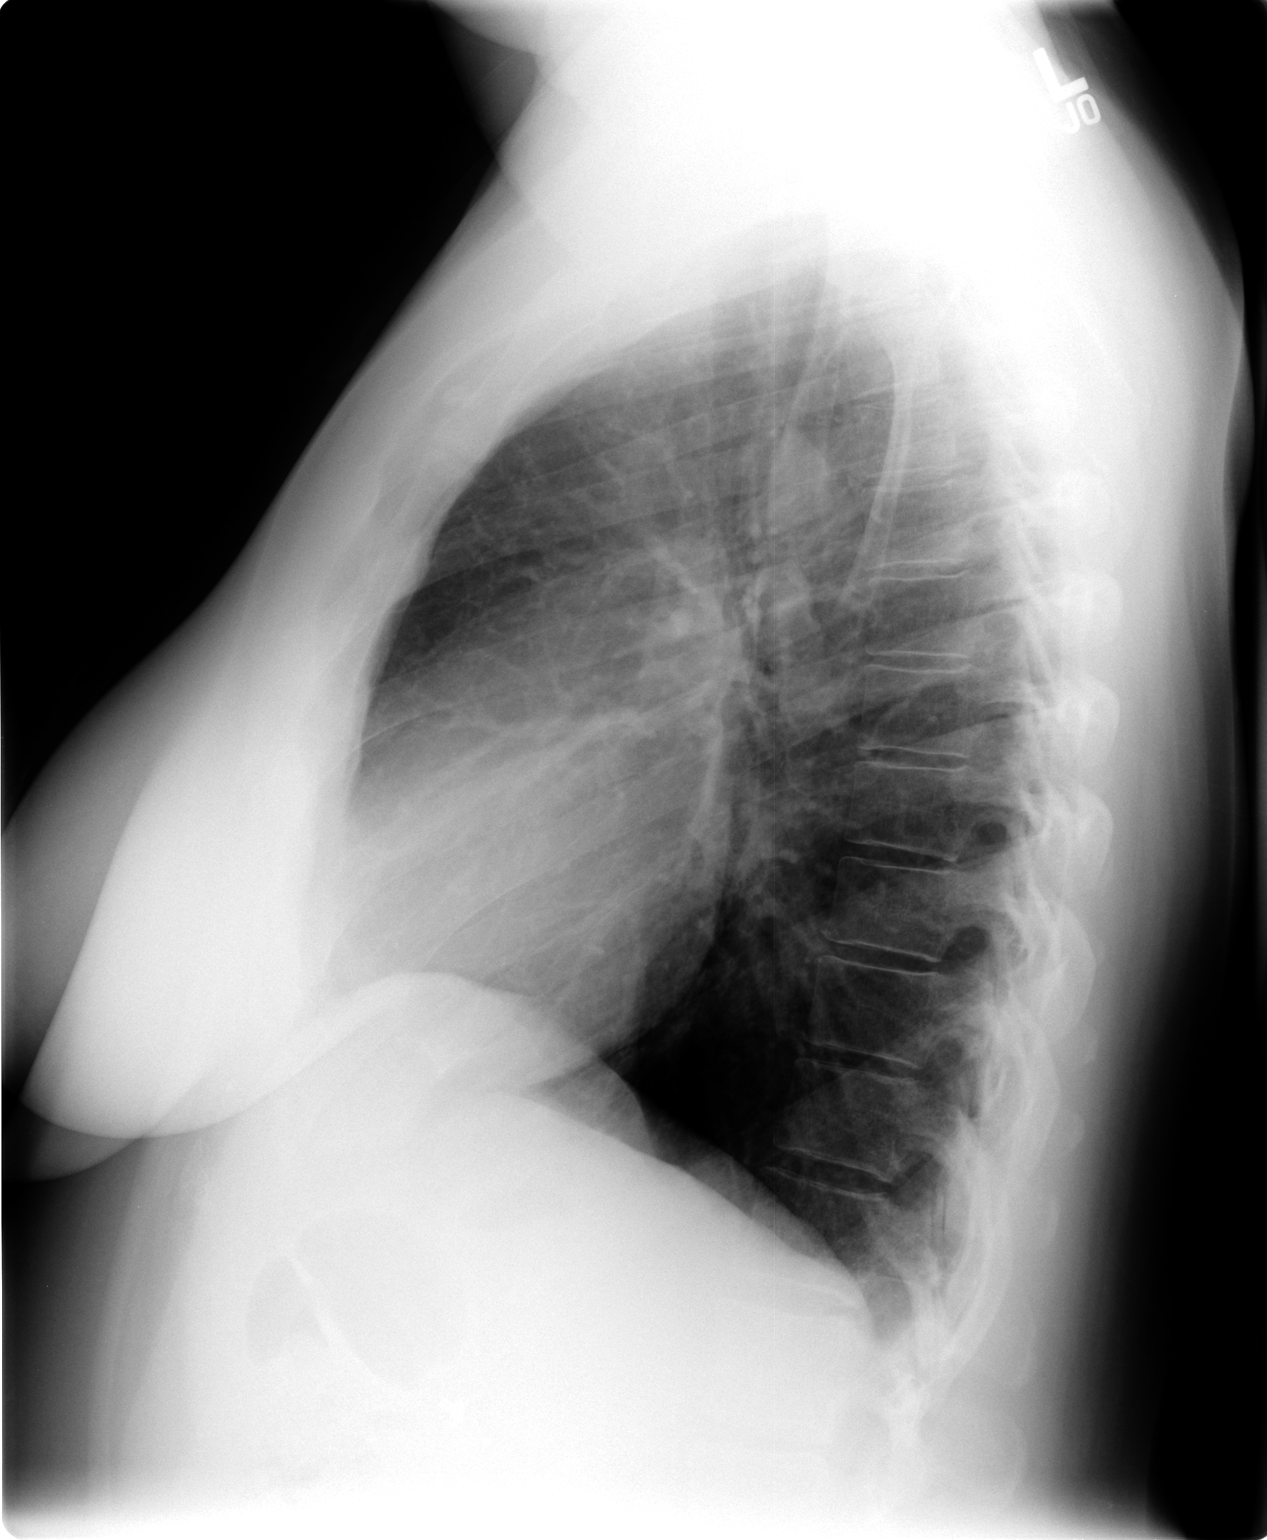

[2 of 2 positions shown; findings below may reference images not displayed]

FINDINGS: The lungs are clear.  Mediastinal contours are normal.
The heart is within normal limits in size.  No bony abnormality is
seen.
IMPRESSION: No active lung disease.

## 2011-09-11 ENCOUNTER — Other Ambulatory Visit: Payer: Self-pay | Admitting: Women's Health

## 2011-09-17 DIAGNOSIS — F329 Major depressive disorder, single episode, unspecified: Secondary | ICD-10-CM | POA: Insufficient documentation

## 2011-09-24 ENCOUNTER — Ambulatory Visit (INDEPENDENT_AMBULATORY_CARE_PROVIDER_SITE_OTHER): Payer: BC Managed Care – PPO | Admitting: Women's Health

## 2011-09-24 ENCOUNTER — Other Ambulatory Visit (HOSPITAL_COMMUNITY)
Admission: RE | Admit: 2011-09-24 | Discharge: 2011-09-24 | Disposition: A | Discharge status: Home / Self Care | Payer: BC Managed Care – PPO | Source: Ambulatory Visit | Attending: Women's Health | Admitting: Women's Health

## 2011-09-24 ENCOUNTER — Encounter: Payer: Self-pay | Admitting: Women's Health

## 2011-09-24 ENCOUNTER — Other Ambulatory Visit: Payer: Self-pay | Admitting: Women's Health

## 2011-09-24 VITALS — BP 110/70 | Ht 66.5 in | Wt 216.0 lb

## 2011-09-24 DIAGNOSIS — Z01419 Encounter for gynecological examination (general) (routine) without abnormal findings: Secondary | ICD-10-CM

## 2011-09-24 DIAGNOSIS — Z13220 Encounter for screening for lipoid disorders: Secondary | ICD-10-CM

## 2011-09-24 DIAGNOSIS — Z833 Family history of diabetes mellitus: Secondary | ICD-10-CM

## 2011-09-24 DIAGNOSIS — F329 Major depressive disorder, single episode, unspecified: Secondary | ICD-10-CM

## 2011-09-24 MED ORDER — SERTRALINE HCL 100 MG PO TABS: 100.0000 mg | ORAL_TABLET | Freq: Every day | ORAL | Status: DC

## 2011-09-24 MED ORDER — OMEGA-3-ACID ETHYL ESTERS 1 G PO CAPS: 2.0000 g | ORAL_CAPSULE | Freq: Two times a day (BID) | ORAL | Status: DC

## 2011-09-24 NOTE — Progress Notes (Signed)
Angie [name] [birth year] [id number]    History:    The patient presents for annual exam.  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 Son Angie [name] in fourth grade and doing well.   Past medical history, past surgical history, family history and social history were all reviewed and documented in the EPIC chart.   ROS:  A  ROS was performed and pertinent positives and negatives are included in the history.  Exam:  Filed Vitals:   09/24/11 0834  BP: 110/70    General appearance:  Normal Head/Neck:  Normal, without cervical or supraclavicular adenopathy. Thyroid:  Symmetrical, normal in size, without palpable masses or nodularity. Respiratory  Effort:  Normal  Auscultation:  Clear without wheezing or rhonchi Cardiovascular  Auscultation:  Regular rate, without rubs, murmurs or gallops  Edema/varicosities:  Not grossly evident Abdominal  Soft,nontender, without masses, guarding or rebound.  Liver/spleen:  No organomegaly noted  Hernia:  None appreciated  Skin  Inspection:  Grossly normal  Palpation:  Grossly normal Neurologic/psychiatric  Orientation:  Normal with appropriate conversation.  Mood/affect:  Normal  Genitourinary    Breasts: Examined lying and sitting.     Right: Without masses, retractions, discharge or axillary adenopathy.     Left: Without masses, retractions, discharge or axillary adenopathy.   Inguinal/mons:  Normal without inguinal adenopathy  External genitalia:  Normal  BUS/Urethra/Skene's glands:  Normal  Bladder:  Normal  Vagina:  Normal  Cervix:  absent  Uterus:  absent  Adnexa/parametria:     Rt: Without masses or tenderness.   Lt: Without masses or tenderness.  Anus and perineum: Normal  Digital rectal exam: Normal sphincter tone without palpated masses or tenderness  Assessment/Plan:  45 y.o.MWF G2P1  for annual exam.  History of a TAH and RSO/endometrioma. Denies any menopausal symptoms or problems. She is on Zoloft 100 for depression, depression symptoms are  stable, wishes to continue. Denies need for counseling.  Normal GYN exam exam with depression.  Plan: Zoloft 100 prescription proper use was given and reviewed, increase leisure activities. SBEs, annual mammogram, which she has not had strongly encouraged her to schedule. Exercise, cutting calories for weight loss encouraged, vitamin D 1000 daily encouraged. CBC, glucose, lipid profile, UA and Pap.   Harrington Challenger [facility], 12:40 PM 09/24/2011

## 2011-09-24 NOTE — Progress Notes (Signed)
Addended by: Harrington Challenger on: 09/24/2011 02:21 PM   Modules accepted: Orders

## 2011-09-28 ENCOUNTER — Other Ambulatory Visit: Payer: Self-pay | Admitting: Women's Health

## 2011-09-28 DIAGNOSIS — B373 Candidiasis of vulva and vagina: Secondary | ICD-10-CM

## 2011-09-28 MED ORDER — FLUCONAZOLE 150 MG PO TABS: 150.0000 mg | ORAL_TABLET | Freq: Once | ORAL | Status: AC

## 2011-10-09 ENCOUNTER — Encounter: Payer: Self-pay | Admitting: Women's Health

## 2012-09-14 ENCOUNTER — Other Ambulatory Visit: Payer: Self-pay | Admitting: Women's Health

## 2012-09-14 NOTE — Telephone Encounter (Signed)
Has CE 09/28/12.

## 2012-09-28 ENCOUNTER — Ambulatory Visit (INDEPENDENT_AMBULATORY_CARE_PROVIDER_SITE_OTHER): Payer: BC Managed Care – PPO | Admitting: Women's Health

## 2012-09-28 ENCOUNTER — Encounter: Payer: Self-pay | Admitting: Women's Health

## 2012-09-28 VITALS — BP 130/74 | Ht 67.0 in | Wt 205.0 lb

## 2012-09-28 DIAGNOSIS — E7800 Pure hypercholesterolemia, unspecified: Secondary | ICD-10-CM

## 2012-09-28 DIAGNOSIS — Z01419 Encounter for gynecological examination (general) (routine) without abnormal findings: Secondary | ICD-10-CM

## 2012-09-28 DIAGNOSIS — Z833 Family history of diabetes mellitus: Secondary | ICD-10-CM

## 2012-09-28 DIAGNOSIS — F329 Major depressive disorder, single episode, unspecified: Secondary | ICD-10-CM

## 2012-09-28 DIAGNOSIS — Z13220 Encounter for screening for lipoid disorders: Secondary | ICD-10-CM

## 2012-09-28 LAB — CBC WITH DIFFERENTIAL/PLATELET
Basophils Absolute: 0 10*3/uL (ref 0.0–0.1)
Basophils Relative: 1 % (ref 0–1)
Eosinophils Absolute: 0.1 10*3/uL (ref 0.0–0.7)
Hemoglobin: 12.9 g/dL (ref 12.0–15.0)
MCH: 32.3 pg (ref 26.0–34.0)
MCHC: 34.2 g/dL (ref 30.0–36.0)
Neutro Abs: 4.7 10*3/uL (ref 1.7–7.7)
Neutrophils Relative %: 61 % (ref 43–77)
Platelets: 209 10*3/uL (ref 150–400)
RDW: 12.9 % (ref 11.5–15.5)

## 2012-09-28 LAB — LIPID PANEL
Cholesterol: 160 mg/dL (ref 0–200)
Total CHOL/HDL Ratio: 3 Ratio

## 2012-09-28 LAB — GLUCOSE, RANDOM: Glucose, Bld: 92 mg/dL (ref 70–99)

## 2012-09-28 MED ORDER — SERTRALINE HCL 100 MG PO TABS: 100.0000 mg | ORAL_TABLET | Freq: Every day | ORAL | Status: DC

## 2012-09-28 MED ORDER — ESTRADIOL 0.05 MG/24HR TD PTWK: 1.0000 | MEDICATED_PATCH | TRANSDERMAL | Status: DC

## 2012-09-28 MED ORDER — OMEGA-3-ACID ETHYL ESTERS 1 G PO CAPS: 2.0000 g | ORAL_CAPSULE | Freq: Two times a day (BID) | ORAL | Status: DC

## 2012-09-28 NOTE — Progress Notes (Signed)
Angie [name] 1949/09/17 [id number]    History:    The patient presents for annual exam.  TAH with RSO  for endometrioma 2007. History of depression on Zoloft 100 mg daily. States over the past 6 months has had increased periods of unexplained tearfulness with moodiness. States feels her moods go up and down randomly without cause, questions if related to waning estrogen. Having occasional hot flushes, no cyclic breast tenderness for at least 6 months. States work life is very busy, home life is good. History of increased triglycerides on the lovaza. History of normal Paps and mammograms.   Past medical history, past surgical history, family history and social history were all reviewed and documented in the EPIC chart. 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 Son [name] 10, doing well. Mother history of depression and increased cholesterol and triglycerides. Father insulin-dependent diabetic. History of a cholecystectomy in 2006, appendectomy 84.   ROS:  A  ROS was performed and pertinent positives and negatives are included in the history.  Exam:  Filed Vitals:   09/28/12 0803  BP: 130/74    General appearance:  Normal Head/Neck:  Normal, without cervical or supraclavicular adenopathy. Thyroid:  Symmetrical, normal in size, without palpable masses or nodularity. Respiratory  Effort:  Normal  Auscultation:  Clear without wheezing or rhonchi Cardiovascular  Auscultation:  Regular rate, without rubs, murmurs or gallops  Edema/varicosities:  Not grossly evident Abdominal  Soft,nontender, without masses, guarding or rebound.  Liver/spleen:  No organomegaly noted  Hernia:  None appreciated  Skin  Inspection:  Grossly normal  Palpation:  Grossly normal Neurologic/psychiatric  Orientation:  Normal with appropriate conversation.  Mood/affect:  Normal  Genitourinary    Breasts: Examined lying and sitting.     Right: Without masses, retractions, discharge or axillary adenopathy.     Left: Without  masses, retractions, discharge or axillary adenopathy.   Inguinal/mons:  Normal without inguinal adenopathy  External genitalia:  Normal  BUS/Urethra/Skene's glands:  Normal  Bladder:  Normal  Vagina:  Normal  Cervix:  Absent  Uterus: Absent  Adnexa/parametria:     Rt: Without masses or tenderness.   Lt: Without masses or tenderness.  Anus and perineum: Normal  Digital rectal exam: Normal sphincter tone without palpated masses or tenderness  Assessment/Plan:  46 y.o. M. WF G2 P1  for annual exam with complaint of increased moodiness with periods of tearfulness.  Depression stable on Zoloft 100 Perimenopausal mood changes Increased triglycerides Obesity  Plan: Zoloft 100 by mouth daily prescription, proper use given and reviewed. Declines need for counseling at this time. Options reviewed for perimenopausal symptoms. Will try estradiol 0.05 patch weekly. Reviewed slight risk for blood clots, strokes, breast cancer. Instructed to call if no relief of symptoms after one month. SBE's, annual mammogram, exercise, calcium rich diet, vitamin D 1000 daily encouraged. Encouraged to increase exercise and decrease calories for weight loss. Lovaza twice daily prescription, proper use given and reviewed. CBC, glucose, lipid panel, UA. No Pap history of normal Paps new screening guidelines reviewed.   Harrington Challenger WHNP, 9:30 AM 09/28/2012

## 2012-09-28 NOTE — Patient Instructions (Signed)

## 2012-09-29 LAB — URINALYSIS W MICROSCOPIC + REFLEX CULTURE
Bacteria, UA: NONE SEEN
Bilirubin Urine: NEGATIVE
Crystals: NONE SEEN
Glucose, UA: NEGATIVE mg/dL
Ketones, ur: NEGATIVE mg/dL
Specific Gravity, Urine: 1.018 (ref 1.005–1.030)
Squamous Epithelial / LPF: NONE SEEN
Urobilinogen, UA: 0.2 mg/dL (ref 0.0–1.0)

## 2013-03-30 ENCOUNTER — Encounter: Payer: Self-pay | Admitting: *Deleted

## 2013-03-30 NOTE — Progress Notes (Signed)
Patient ID: Angie [name], female   DOB: June 03, 1966, 47 y.o.   MRN: [id number] Pt found lump on right side of breast, pt had mammogram scheduled today per nancy okay for diag. Mammogram with possible u/s order faxed to [facility].

## 2013-04-03 ENCOUNTER — Other Ambulatory Visit: Payer: Self-pay | Admitting: *Deleted

## 2013-04-03 ENCOUNTER — Encounter: Payer: Self-pay | Admitting: Women's Health

## 2013-04-03 DIAGNOSIS — Z9889 Other specified postprocedural states: Secondary | ICD-10-CM

## 2013-04-05 ENCOUNTER — Other Ambulatory Visit: Payer: Self-pay | Admitting: Radiology

## 2013-04-17 ENCOUNTER — Other Ambulatory Visit: Payer: Self-pay | Admitting: *Deleted

## 2013-04-17 DIAGNOSIS — Z9889 Other specified postprocedural states: Secondary | ICD-10-CM

## 2013-06-21 ENCOUNTER — Ambulatory Visit (INDEPENDENT_AMBULATORY_CARE_PROVIDER_SITE_OTHER): Payer: BC Managed Care – PPO | Admitting: Women's Health

## 2013-06-21 ENCOUNTER — Encounter: Payer: Self-pay | Admitting: Women's Health

## 2013-06-21 DIAGNOSIS — E079 Disorder of thyroid, unspecified: Secondary | ICD-10-CM

## 2013-06-21 DIAGNOSIS — Z79890 Hormone replacement therapy (postmenopausal): Secondary | ICD-10-CM

## 2013-06-21 DIAGNOSIS — F411 Generalized anxiety disorder: Secondary | ICD-10-CM

## 2013-06-21 DIAGNOSIS — F329 Major depressive disorder, single episode, unspecified: Secondary | ICD-10-CM

## 2013-06-21 LAB — TSH: TSH: 2.568 u[IU]/mL (ref 0.350–4.500)

## 2013-06-21 MED ORDER — ESCITALOPRAM OXALATE 10 MG PO TABS: 10.0000 mg | ORAL_TABLET | Freq: Every day | ORAL | Status: DC

## 2013-06-21 MED ORDER — ESTRADIOL 0.5 MG PO TABS: 0.5000 mg | ORAL_TABLET | Freq: Every day | ORAL | Status: DC

## 2013-06-21 NOTE — Progress Notes (Signed)
Patient ID: Madine Sarr, female   DOB: 1966-02-09, 47 y.o.   MRN: [id number] Presents with complaints of feeling overwhelmed, fatigue, stressed out, anxious at times. States she feels her moods are all over the place. Long-term history of depression has been on Zoloft 100 at least 10 years. Reports home life is good other than feeling that she can't get a hold of housework/chores. 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 Has had counseling in the past, states she doesn't feel the need. Denies hot flushes, vaginal dryness, feeling of harming self. On Climara 0.05 patch weekly but states falls off most weeks, never stays on the full week. TAH with RSO endometrioma.   Exam: Appears sad, good eye contact, able to articulate feelings.  Menopausal Anxiety/depression  Plan: Will try decreasing Zoloft to 50 mg daily and adding in Lexapro 10 mg daily. Reviewed tapering Zoloft gradually. Reviewed may need to increase Lexapro to 20 mg. If no relief reviewed psychiatrist to help with medication adjustment. Will stop the Climara patch and start Estrace 0.05 daily. Reviewed  possibly cutting work hours, getting help at home to manage household. Reviewed importance of regular daily exercise for mood. TSH pending.

## 2013-10-17 ENCOUNTER — Other Ambulatory Visit: Payer: Self-pay | Admitting: Women's Health

## 2013-10-17 NOTE — Telephone Encounter (Signed)
Patient has her yearly checkup with you 10/19/13.

## 2013-10-17 NOTE — Telephone Encounter (Signed)
Patient has CE scheduled 10.23.14

## 2013-10-19 ENCOUNTER — Ambulatory Visit (INDEPENDENT_AMBULATORY_CARE_PROVIDER_SITE_OTHER): Payer: BC Managed Care – PPO | Admitting: Women's Health

## 2013-10-19 ENCOUNTER — Encounter: Payer: Self-pay | Admitting: Women's Health

## 2013-10-19 VITALS — BP 114/68 | Ht 67.25 in | Wt 199.0 lb

## 2013-10-19 DIAGNOSIS — F411 Generalized anxiety disorder: Secondary | ICD-10-CM

## 2013-10-19 DIAGNOSIS — Z01419 Encounter for gynecological examination (general) (routine) without abnormal findings: Secondary | ICD-10-CM

## 2013-10-19 DIAGNOSIS — E079 Disorder of thyroid, unspecified: Secondary | ICD-10-CM

## 2013-10-19 DIAGNOSIS — Z13220 Encounter for screening for lipoid disorders: Secondary | ICD-10-CM

## 2013-10-19 DIAGNOSIS — Z79890 Hormone replacement therapy (postmenopausal): Secondary | ICD-10-CM

## 2013-10-19 DIAGNOSIS — Z833 Family history of diabetes mellitus: Secondary | ICD-10-CM

## 2013-10-19 LAB — COMPREHENSIVE METABOLIC PANEL
ALT: 13 U/L (ref 0–35)
AST: 14 U/L (ref 0–37)
Calcium: 9.2 mg/dL (ref 8.4–10.5)
Chloride: 104 mEq/L (ref 96–112)
Creat: 0.55 mg/dL (ref 0.50–1.10)
Glucose, Bld: 92 mg/dL (ref 70–99)
Potassium: 4.1 mEq/L (ref 3.5–5.3)
Total Bilirubin: 0.5 mg/dL (ref 0.3–1.2)

## 2013-10-19 LAB — CBC WITH DIFFERENTIAL/PLATELET
Basophils Absolute: 0 10*3/uL (ref 0.0–0.1)
Eosinophils Absolute: 0.1 10*3/uL (ref 0.0–0.7)
Eosinophils Relative: 2 % (ref 0–5)
Lymphocytes Relative: 27 % (ref 12–46)
MCH: 32.3 pg (ref 26.0–34.0)
MCV: 93 fL (ref 78.0–100.0)
Neutro Abs: 4.8 10*3/uL (ref 1.7–7.7)
Platelets: 238 10*3/uL (ref 150–400)
RDW: 13 % (ref 11.5–15.5)
WBC: 7.4 10*3/uL (ref 4.0–10.5)

## 2013-10-19 LAB — LIPID PANEL
LDL Cholesterol: 71 mg/dL (ref 0–99)
Total CHOL/HDL Ratio: 2.8 Ratio
VLDL: 44 mg/dL — ABNORMAL HIGH (ref 0–40)

## 2013-10-19 MED ORDER — ESCITALOPRAM OXALATE 10 MG PO TABS: ORAL_TABLET | ORAL | Status: DC

## 2013-10-19 MED ORDER — ESTRADIOL 0.5 MG PO TABS: 0.5000 mg | ORAL_TABLET | Freq: Every day | ORAL | Status: DC

## 2013-10-19 NOTE — Patient Instructions (Addendum)
Health Recommendations for Postmenopausal Women Respected and ongoing research has looked at the most common causes of death, disability, and poor quality of life in postmenopausal women. The causes include heart disease, diseases of blood vessels, diabetes, depression, cancer, and bone loss (osteoporosis). Many things can be done to help lower the chances of developing these and other common problems: CARDIOVASCULAR DISEASE Heart Disease: A heart attack is a medical emergency. Know the signs and symptoms of a heart attack. Below are things women can do to reduce their risk for heart disease.   Do not smoke. If you smoke, quit.  Aim for a healthy weight. Being overweight causes many preventable deaths. Eat a healthy and balanced diet and drink an adequate amount of liquids.  Get moving. Make a commitment to be more physically active. Aim for 30 minutes of activity on most, if not all days of the week.  Eat for heart health. Choose a diet that is low in saturated fat and cholesterol and eliminate trans fat. Include whole grains, vegetables, and fruits. Read and understand the labels on food containers before buying.  Know your numbers. Ask your caregiver to check your blood pressure, cholesterol (total, HDL, LDL, triglycerides) and blood glucose. Work with your caregiver on improving your entire clinical picture.  High blood pressure. Limit or stop your table salt intake (try salt substitute and food seasonings). Avoid salty foods and drinks. Read labels on food containers before buying. Eating well and exercising can help control high blood pressure. STROKE  Stroke is a medical emergency. Stroke may be the result of a blood clot in a blood vessel in the brain or by a brain hemorrhage (bleeding). Know the signs and symptoms of a stroke. To lower the risk of developing a stroke:  Avoid fatty foods.  Quit smoking.  Control your diabetes, blood pressure, and irregular heart rate. THROMBOPHLEBITIS  (BLOOD CLOT) OF THE LEG  Becoming overweight and leading a stationary lifestyle may also contribute to developing blood clots. Controlling your diet and exercising will help lower the risk of developing blood clots. CANCER SCREENING  Breast Cancer: Take steps to reduce your risk of breast cancer.  You should practice "breast self-awareness." This means understanding the normal appearance and feel of your breasts and should include breast self-examination. Any changes detected, no matter how small, should be reported to your caregiver.  After age 40, you should have a clinical breast exam (CBE) every year.  Starting at age 40, you should consider having a mammogram (breast X-ray) every year.  If you have a family history of breast cancer, talk to your caregiver about genetic screening.  If you are at high risk for breast cancer, talk to your caregiver about having an MRI and a mammogram every year.  Intestinal or Stomach Cancer: Tests to consider are a rectal exam, fecal occult blood, sigmoidoscopy, and colonoscopy. Women who are high risk may need to be screened at an earlier age and more often.  Cervical Cancer:  Beginning at age 30, you should have a Pap test every 3 years as long as the past 3 Pap tests have been normal.  If you have had past treatment for cervical cancer or a condition that could lead to cancer, you need Pap tests and screening for cancer for at least 20 years after your treatment.  If you had a hysterectomy for a problem that was not cancer or a condition that could lead to cancer, then you no longer need Pap tests.    If you are between ages 65 and 70, and you have had normal Pap tests going back 10 years, you no longer need Pap tests.  If Pap tests have been discontinued, risk factors (such as a new sexual partner) need to be reassessed to determine if screening should be resumed.  Some medical problems can increase the chance of getting cervical cancer. In these  cases, your caregiver may recommend more frequent screening and Pap tests.  Uterine Cancer: If you have vaginal bleeding after reaching menopause, you should notify your caregiver.  Ovarian cancer: Other than yearly pelvic exams, there are no reliable tests available to screen for ovarian cancer at this time except for yearly pelvic exams.  Lung Cancer: Yearly chest X-rays can detect lung cancer and should be done on high risk women, such as cigarette smokers and women with chronic lung disease (emphysema).  Skin Cancer: A complete body skin exam should be done at your yearly examination. Avoid overexposure to the sun and ultraviolet light lamps. Use a strong sun block cream when in the sun. All of these things are important in lowering the risk of skin cancer. MENOPAUSE Menopause Symptoms: Hormone therapy products are effective for treating symptoms associated with menopause:  Moderate to severe hot flashes.  Night sweats.  Mood swings.  Headaches.  Tiredness.  Loss of sex drive.  Insomnia.  Other symptoms. Hormone replacement carries certain risks, especially in older women. Women who use or are thinking about using estrogen or estrogen with progestin treatments should discuss that with their caregiver. Your caregiver will help you understand the benefits and risks. The ideal dose of hormone replacement therapy is not known. The Food and Drug Administration (FDA) has concluded that hormone therapy should be used only at the lowest doses and for the shortest amount of time to reach treatment goals.  OSTEOPOROSIS Protecting Against Bone Loss and Preventing Fracture: If you use hormone therapy for prevention of bone loss (osteoporosis), the risks for bone loss must outweigh the risk of the therapy. Ask your caregiver about other medications known to be safe and effective for preventing bone loss and fractures. To guard against bone loss or fractures, the following is recommended:  If  you are less than age 50, take 1000 mg of calcium and at least 600 mg of Vitamin D per day.  If you are greater than age 50 but less than age 70, take 1200 mg of calcium and at least 600 mg of Vitamin D per day.  If you are greater than age 70, take 1200 mg of calcium and at least 800 mg of Vitamin D per day. Smoking and excessive alcohol intake increases the risk of osteoporosis. Eat foods rich in calcium and vitamin D and do weight bearing exercises several times a week as your caregiver suggests. DIABETES Diabetes Melitus: If you have Type I or Type 2 diabetes, you should keep your blood sugar under control with diet, exercise and recommended medication. Avoid too many sweets, starchy and fatty foods. Being overweight can make control more difficult. COGNITION AND MEMORY Cognition and Memory: Menopausal hormone therapy is not recommended for the prevention of cognitive disorders such as Alzheimer's disease or memory loss.  DEPRESSION  Depression may occur at any age, but is common in elderly women. The reasons may be because of physical, medical, social (loneliness), or financial problems and needs. If you are experiencing depression because of medical problems and control of symptoms, talk to your caregiver about this. Physical activity and   exercise may help with mood and sleep. Community and volunteer involvement may help your sense of value and worth. If you have depression and you feel that the problem is getting worse or becoming severe, talk to your caregiver about treatment options that are best for you. ACCIDENTS  Accidents are common and can be serious in the elderly woman. Prepare your house to prevent accidents. Eliminate throw rugs, place hand bars in the bath, shower and toilet areas. Avoid wearing high heeled shoes or walking on wet, snowy, and icy areas. Limit or stop driving if you have vision or hearing problems, or you feel you are unsteady with you movements and  reflexes. HEPATITIS C Hepatitis C is a type of viral infection affecting the liver. It is spread mainly through contact with blood from an infected person. It can be treated, but if left untreated, it can lead to severe liver damage over years. Many people who are infected do not know that the virus is in their blood. If you are a "baby-boomer", it is recommended that you have one screening test for Hepatitis C. IMMUNIZATIONS  Several immunizations are important to consider having during your senior years, including:   Tetanus, diptheria, and pertussis booster shot.  Influenza every year before the flu season begins.  Pneumonia vaccine.  Shingles vaccine.  Others as indicated based on your specific needs. Talk to your caregiver about these. Document Released: 02/05/2006 Document Revised: 11/30/2012 Document Reviewed: 10/01/2008 [facility] Patient Information 2014 [place], [place].  Take 2000 vit d daily

## 2013-10-19 NOTE — Progress Notes (Signed)
Angie [name] [birth year] [id number]    History:    The patient presents for annual exam.  TAH with RSO for endometrioma 2007. Normal Pap history. Benign right breast biopsy 03/2013 On estradiol 0.5 mg daily with minimal menopausal symptoms. Lexapro 20 anxiety improved, also decreased working to 4 days. Lovaza, triglycerides improved.   Past medical history, past surgical history, family history and social history were all reviewed and documented in the EPIC chart. 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 [name] 11 doing well. Cholecystectomy 2006, appendectomy 84.   ROS:  A  ROS was performed and pertinent positives and negatives are included in the history.  Exam:  Filed Vitals:   10/19/13 0859  BP: 114/68    General appearance:  Normal Head/Neck:  Normal, without cervical or supraclavicular adenopathy. Thyroid:  Symmetrical, normal in size, without palpable masses or nodularity. Respiratory  Effort:  Normal  Auscultation:  Clear without wheezing or rhonchi Cardiovascular  Auscultation:  Regular rate, without rubs, murmurs or gallops  Edema/varicosities:  Not grossly evident Abdominal  Soft,nontender, without masses, guarding or rebound.  Liver/spleen:  No organomegaly noted  Hernia:  None appreciated  Skin  Inspection:  Grossly normal  Palpation:  Grossly normal Neurologic/psychiatric  Orientation:  Normal with appropriate conversation.  Mood/affect:  Normal  Genitourinary    Breasts: Examined lying and sitting.     Right: Without masses, retractions, discharge or axillary adenopathy.     Left: Without masses, retractions, discharge or axillary adenopathy.   Inguinal/mons:  Normal without inguinal adenopathy  External genitalia:  Normal  BUS/Urethra/Skene's glands:  Normal  Bladder:  Normal  Vagina:  Normal  Cervix: Absent  Uterus: Absent  Adnexa/parametria:     Rt: Without masses or tenderness.   Lt: Without masses or tenderness.  Anus and perineum: Normal  Digital rectal  exam: Normal sphincter tone without palpated masses or tenderness  Assessment/Plan:  47 y.o. MWF G1P1 for annual exam.   Anxiety/depression stable on Lexapro 20 Postmenopausal on estradiol 0.5 Increased triglycerides with improvement on Lovaza  Plan: Lovaza prescription, proper use given and reviewed. Continue active lifestyle, exercise as able, less than 20 g saturated fat daily, fiber rich foods daily. SBE's, continue annual mammogram, calcium rich diet, leisure activities encouraged. Lexapro 20 mg daily, counseling as needed, CBC, CMET, lipid panel, TSH, UA,.   Harrington Challenger WHNP, 5:26 PM 10/19/2013

## 2013-10-20 LAB — URINALYSIS W MICROSCOPIC + REFLEX CULTURE
Leukocytes, UA: NEGATIVE
Nitrite: NEGATIVE
Protein, ur: NEGATIVE mg/dL
Squamous Epithelial / LPF: NONE SEEN
Urobilinogen, UA: 0.2 mg/dL (ref 0.0–1.0)

## 2013-11-26 ENCOUNTER — Other Ambulatory Visit: Payer: Self-pay | Admitting: Women's Health

## 2014-01-28 HISTORY — PX: BREAST BIOPSY: SHX20

## 2014-06-28 ENCOUNTER — Other Ambulatory Visit: Payer: Self-pay | Admitting: Women's Health

## 2014-10-29 ENCOUNTER — Encounter: Payer: Self-pay | Admitting: Women's Health

## 2014-11-02 ENCOUNTER — Other Ambulatory Visit: Payer: Self-pay | Admitting: Women's Health

## 2014-11-27 ENCOUNTER — Encounter: Payer: BC Managed Care – PPO | Admitting: Women's Health

## 2014-11-29 ENCOUNTER — Ambulatory Visit (INDEPENDENT_AMBULATORY_CARE_PROVIDER_SITE_OTHER): Payer: BC Managed Care – PPO | Admitting: Women's Health

## 2014-11-29 ENCOUNTER — Encounter: Payer: Self-pay | Admitting: Women's Health

## 2014-11-29 VITALS — BP 118/80 | Ht 67.0 in | Wt 218.0 lb

## 2014-11-29 DIAGNOSIS — Z79890 Hormone replacement therapy (postmenopausal): Secondary | ICD-10-CM

## 2014-11-29 DIAGNOSIS — F329 Major depressive disorder, single episode, unspecified: Secondary | ICD-10-CM

## 2014-11-29 DIAGNOSIS — E785 Hyperlipidemia, unspecified: Secondary | ICD-10-CM

## 2014-11-29 DIAGNOSIS — F32A Depression, unspecified: Secondary | ICD-10-CM

## 2014-11-29 DIAGNOSIS — Z01419 Encounter for gynecological examination (general) (routine) without abnormal findings: Secondary | ICD-10-CM

## 2014-11-29 LAB — LIPID PANEL
Cholesterol: 200 mg/dL (ref 0–200)
HDL: 58 mg/dL (ref >39)
LDL Cholesterol: 116 mg/dL — ABNORMAL HIGH (ref 0–99)
TRIGLYCERIDES: 130 mg/dL (ref <150)
Total CHOL/HDL Ratio: 3.4 Ratio
VLDL: 26 mg/dL (ref 0–40)

## 2014-11-29 LAB — COMPREHENSIVE METABOLIC PANEL
ALBUMIN: 3.9 g/dL (ref 3.5–5.2)
ALK PHOS: 46 U/L (ref 39–117)
ALT: 17 U/L (ref 0–35)
AST: 15 U/L (ref 0–37)
BILIRUBIN TOTAL: 0.5 mg/dL (ref 0.2–1.2)
BUN: 20 mg/dL (ref 6–23)
CO2: 27 mEq/L (ref 19–32)
Calcium: 9.5 mg/dL (ref 8.4–10.5)
Chloride: 104 mEq/L (ref 96–112)
Creat: 0.6 mg/dL (ref 0.50–1.10)
GLUCOSE: 92 mg/dL (ref 70–99)
POTASSIUM: 4.6 meq/L (ref 3.5–5.3)
SODIUM: 139 meq/L (ref 135–145)
TOTAL PROTEIN: 6.3 g/dL (ref 6.0–8.3)

## 2014-11-29 LAB — CBC WITH DIFFERENTIAL/PLATELET
BASOS ABS: 0 10*3/uL (ref 0.0–0.1)
Basophils Relative: 0 % (ref 0–1)
Eosinophils Absolute: 0.1 10*3/uL (ref 0.0–0.7)
Eosinophils Relative: 2 % (ref 0–5)
HCT: 38.2 % (ref 36.0–46.0)
Hemoglobin: 13.3 g/dL (ref 12.0–15.0)
Lymphocytes Relative: 30 % (ref 12–46)
Lymphs Abs: 2 10*3/uL (ref 0.7–4.0)
MCH: 32.4 pg (ref 26.0–34.0)
MCHC: 34.8 g/dL (ref 30.0–36.0)
MCV: 93.2 fL (ref 78.0–100.0)
MPV: 10.1 fL (ref 9.4–12.4)
Monocytes Absolute: 0.4 10*3/uL (ref 0.1–1.0)
Monocytes Relative: 6 % (ref 3–12)
NEUTROS ABS: 4 10*3/uL (ref 1.7–7.7)
NEUTROS PCT: 62 % (ref 43–77)
PLATELETS: 214 10*3/uL (ref 150–400)
RBC: 4.1 MIL/uL (ref 3.87–5.11)
RDW: 12.6 % (ref 11.5–15.5)
WBC: 6.5 10*3/uL (ref 4.0–10.5)

## 2014-11-29 MED ORDER — ESCITALOPRAM OXALATE 10 MG PO TABS: 10.0000 mg | ORAL_TABLET | Freq: Every day | ORAL | Status: DC

## 2014-11-29 MED ORDER — OMEGA-3-ACID ETHYL ESTERS 1 G PO CAPS: 2.0000 | ORAL_CAPSULE | Freq: Two times a day (BID) | ORAL | Status: DC

## 2014-11-29 MED ORDER — ESTRADIOL 0.5 MG PO TABS: 0.5000 mg | ORAL_TABLET | Freq: Every day | ORAL | Status: DC

## 2014-11-29 NOTE — Progress Notes (Signed)
Angie DeedSheila [name] [birth year] [id number]    History:    Presents for annual exam.  2007 TAH with RSO. Normal Pap history. 2014 benign breast biopsy, overdue for mammogram. Estradiol 0.5 mg with good relief of menopausal symptoms. Lexapro 10 mg daily for depression stable.  Lovaza for hypertriglycerides. Has not had a colonoscopy.  Past medical history, past surgical history, family history and social history were all reviewed and documented in the EPIC chart. 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 Mother hypercholesteremia, father diabetes.  ROS:  A  12 point ROS was performed and pertinent positives and negatives are included.  Exam:  Filed Vitals:   11/29/14 0803  BP: 118/80    General appearance:  Normal Thyroid:  Symmetrical, normal in size, without palpable masses or nodularity. Respiratory  Auscultation:  Clear without wheezing or rhonchi Cardiovascular  Auscultation:  Regular rate, without rubs, murmurs or gallops  Edema/varicosities:  Not grossly evident Abdominal  Soft,nontender, without masses, guarding or rebound.  Liver/spleen:  No organomegaly noted  Hernia:  None appreciated  Skin  Inspection:  Grossly normal   Breasts: Examined lying and sitting.     Right: Without masses, retractions, discharge or axillary adenopathy.     Left: Without masses, retractions, discharge or axillary adenopathy. Gentitourinary   Inguinal/mons:  Normal without inguinal adenopathy  External genitalia:  Normal  BUS/Urethra/Skene's glands:  Normal  Vagina:  Normal  Cervix:  Absent  Uterus:  Absent Adnexa/parametria:     Rt: Without masses or tenderness.   Lt: Without masses or tenderness.  Anus and perineum: Normal  Digital rectal exam: Normal sphincter tone without palpated masses or tenderness  Assessment/Plan:  48 y.o. MWF G1P1 for annual exam with no complaints.  TVH with RSO on estradiol with good relief of symptoms Lexapro  depression stable Hypercholesteremia-live asthma  Plan: HRT reviewed risks of blood clots, strokes, breast cancer, less is best for shortest time. Estradiol 0.5 mg daily prescription, proper use given and reviewed. Lexapro 10 mg by mouth daily prescription, proper use given and reviewed declines need for further counseling. Lovaza 2 tablets daily prescription, proper use given and reviewed. Will continue to work on increasing regular exercise, less than 20 g saturated fat daily. SBE's, overdue for mammogram and instructed to schedule, CBC, CMP, lipid panel, UA, Pap screening guidelines reviewed. Need for screening colonoscopy reviewed, [facility].    Temitope Flammer J [facility] WestWHNP, 8:51 AM 11/29/2014

## 2014-11-29 NOTE — Patient Instructions (Signed)
Health Recommendations for Postmenopausal Women Respected and ongoing research has looked at the most common causes of death, disability, and poor quality of life in postmenopausal women. The causes include heart disease, diseases of blood vessels, diabetes, depression, cancer, and bone loss (osteoporosis). Many things can be done to help lower the chances of developing these and other common problems. CARDIOVASCULAR DISEASE Heart Disease: A heart attack is a medical emergency. Know the signs and symptoms of a heart attack. Below are things women can do to reduce their risk for heart disease.   Do not smoke. If you smoke, quit.  Aim for a healthy weight. Being overweight causes many preventable deaths. Eat a healthy and balanced diet and drink an adequate amount of liquids.  Get moving. Make a commitment to be more physically active. Aim for 30 minutes of activity on most, if not all days of the week.  Eat for heart health. Choose a diet that is low in saturated fat and cholesterol and eliminate trans fat. Include whole grains, vegetables, and fruits. Read and understand the labels on food containers before buying.  Know your numbers. Ask your caregiver to check your blood pressure, cholesterol (total, HDL, LDL, triglycerides) and blood glucose. Work with your caregiver on improving your entire clinical picture.  High blood pressure. Limit or stop your table salt intake (try salt substitute and food seasonings). Avoid salty foods and drinks. Read labels on food containers before buying. Eating well and exercising can help control high blood pressure. STROKE  Stroke is a medical emergency. Stroke may be the result of a blood clot in a blood vessel in the brain or by a brain hemorrhage (bleeding). Know the signs and symptoms of a stroke. To lower the risk of developing a stroke:  Avoid fatty foods.  Quit smoking.  Control your diabetes, blood pressure, and irregular heart rate. THROMBOPHLEBITIS  (BLOOD CLOT) OF THE LEG  Becoming overweight and leading a stationary lifestyle may also contribute to developing blood clots. Controlling your diet and exercising will help lower the risk of developing blood clots. CANCER SCREENING  Breast Cancer: Take steps to reduce your risk of breast cancer.  You should practice "breast self-awareness." This means understanding the normal appearance and feel of your breasts and should include breast self-examination. Any changes detected, no matter how small, should be reported to your caregiver.  After age 40, you should have a clinical breast exam (CBE) every year.  Starting at age 40, you should consider having a mammogram (breast X-ray) every year.  If you have a family history of breast cancer, talk to your caregiver about genetic screening.  If you are at high risk for breast cancer, talk to your caregiver about having an MRI and a mammogram every year.  Intestinal or Stomach Cancer: Tests to consider are a rectal exam, fecal occult blood, sigmoidoscopy, and colonoscopy. Women who are high risk may need to be screened at an earlier age and more often.  Cervical Cancer:  Beginning at age 30, you should have a Pap test every 3 years as long as the past 3 Pap tests have been normal.  If you have had past treatment for cervical cancer or a condition that could lead to cancer, you need Pap tests and screening for cancer for at least 20 years after your treatment.  If you had a hysterectomy for a problem that was not cancer or a condition that could lead to cancer, then you no longer need Pap tests.    If you are between ages 65 and 70, and you have had normal Pap tests going back 10 years, you no longer need Pap tests.  If Pap tests have been discontinued, risk factors (such as a new sexual partner) need to be reassessed to determine if screening should be resumed.  Some medical problems can increase the chance of getting cervical cancer. In these  cases, your caregiver may recommend more frequent screening and Pap tests.  Uterine Cancer: If you have vaginal bleeding after reaching menopause, you should notify your caregiver.  Ovarian Cancer: Other than yearly pelvic exams, there are no reliable tests available to screen for ovarian cancer at this time except for yearly pelvic exams.  Lung Cancer: Yearly chest X-rays can detect lung cancer and should be done on high risk women, such as cigarette smokers and women with chronic lung disease (emphysema).  Skin Cancer: A complete body skin exam should be done at your yearly examination. Avoid overexposure to the sun and ultraviolet light lamps. Use a strong sun block cream when in the sun. All of these things are important for lowering the risk of skin cancer. MENOPAUSE Menopause Symptoms: Hormone therapy products are effective for treating symptoms associated with menopause:  Moderate to severe hot flashes.  Night sweats.  Mood swings.  Headaches.  Tiredness.  Loss of sex drive.  Insomnia.  Other symptoms. Hormone replacement carries certain risks, especially in older women. Women who use or are thinking about using estrogen or estrogen with progestin treatments should discuss that with their caregiver. Your caregiver will help you understand the benefits and risks. The ideal dose of hormone replacement therapy is not known. The Food and Drug Administration (FDA) has concluded that hormone therapy should be used only at the lowest doses and for the shortest amount of time to reach treatment goals.  OSTEOPOROSIS Protecting Against Bone Loss and Preventing Fracture If you use hormone therapy for prevention of bone loss (osteoporosis), the risks for bone loss must outweigh the risk of the therapy. Ask your caregiver about other medications known to be safe and effective for preventing bone loss and fractures. To guard against bone loss or fractures, the following is recommended:  If  you are younger than age 50, take 1000 mg of calcium and at least 600 mg of Vitamin D per day.  If you are older than age 50 but younger than age 70, take 1200 mg of calcium and at least 600 mg of Vitamin D per day.  If you are older than age 70, take 1200 mg of calcium and at least 800 mg of Vitamin D per day. Smoking and excessive alcohol intake increases the risk of osteoporosis. Eat foods rich in calcium and vitamin D and do weight bearing exercises several times a week as your caregiver suggests. DIABETES Diabetes Mellitus: If you have type I or type 2 diabetes, you should keep your blood sugar under control with diet, exercise, and recommended medication. Avoid starchy and fatty foods, and too many sweets. Being overweight can make diabetes control more difficult. COGNITION AND MEMORY Cognition and Memory: Menopausal hormone therapy is not recommended for the prevention of cognitive disorders such as Alzheimer's disease or memory loss.  DEPRESSION  Depression may occur at any age, but it is common in elderly women. This may be because of physical, medical, social (loneliness), or financial problems and needs. If you are experiencing depression because of medical problems and control of symptoms, talk to your caregiver about this. Physical   activity and exercise may help with mood and sleep. Community and volunteer involvement may improve your sense of value and worth. If you have depression and you feel that the problem is getting worse or becoming severe, talk to your caregiver about which treatment options are best for you. ACCIDENTS  Accidents are common and can be serious in elderly woman. Prepare your house to prevent accidents. Eliminate throw rugs, place hand bars in bath, shower, and toilet areas. Avoid wearing high heeled shoes or walking on wet, snowy, and icy areas. Limit or stop driving if you have vision or hearing problems, or if you feel you are unsteady with your movements and  reflexes. HEPATITIS C Hepatitis C is a type of viral infection affecting the liver. It is spread mainly through contact with blood from an infected person. It can be treated, but if left untreated, it can lead to severe liver damage over the years. Many people who are infected do not know that the virus is in their blood. If you are a "baby-boomer", it is recommended that you have one screening test for Hepatitis C. IMMUNIZATIONS  Several immunizations are important to consider having during your senior years, including:   Tetanus, diphtheria, and pertussis booster shot.  Influenza every year before the flu season begins.  Pneumonia vaccine.  Shingles vaccine.  Others, as indicated based on your specific needs. Talk to your caregiver about these. Document Released: 02/05/2006 Document Revised: 04/30/2014 Document Reviewed: 10/01/2008 Exit[employer] Patient Information 2015 [employer]. This information is not intended to replace advice given to you by your health care provider. Make sure you discuss any questions you have with your health care provider.  

## 2014-11-30 LAB — URINALYSIS W MICROSCOPIC + REFLEX CULTURE
Bacteria, UA: NONE SEEN
Bilirubin Urine: NEGATIVE
Casts: NONE SEEN
Crystals: NONE SEEN
Glucose, UA: NEGATIVE mg/dL
Hgb urine dipstick: NEGATIVE
Ketones, ur: NEGATIVE mg/dL
LEUKOCYTES UA: NEGATIVE
NITRITE: NEGATIVE
Protein, ur: NEGATIVE mg/dL
Specific Gravity, Urine: 1.021 (ref 1.005–1.030)
Squamous Epithelial / LPF: NONE SEEN
Urobilinogen, UA: 0.2 mg/dL (ref 0.0–1.0)
pH: 6 (ref 5.0–8.0)

## 2015-12-13 ENCOUNTER — Other Ambulatory Visit: Payer: Self-pay | Admitting: Women's Health

## 2015-12-29 ENCOUNTER — Other Ambulatory Visit: Payer: Self-pay | Admitting: Women's Health

## 2016-01-03 ENCOUNTER — Other Ambulatory Visit: Payer: Self-pay | Admitting: Women's Health

## 2016-01-23 ENCOUNTER — Ambulatory Visit (INDEPENDENT_AMBULATORY_CARE_PROVIDER_SITE_OTHER): Payer: BLUE CROSS/BLUE SHIELD | Admitting: Women's Health

## 2016-01-23 ENCOUNTER — Encounter: Payer: Self-pay | Admitting: Women's Health

## 2016-01-23 VITALS — BP 118/80 | Ht 67.0 in | Wt 218.0 lb

## 2016-01-23 DIAGNOSIS — F329 Major depressive disorder, single episode, unspecified: Secondary | ICD-10-CM

## 2016-01-23 DIAGNOSIS — E782 Mixed hyperlipidemia: Secondary | ICD-10-CM

## 2016-01-23 DIAGNOSIS — Z79890 Hormone replacement therapy (postmenopausal): Secondary | ICD-10-CM

## 2016-01-23 DIAGNOSIS — Z23 Encounter for immunization: Secondary | ICD-10-CM | POA: Diagnosis not present

## 2016-01-23 DIAGNOSIS — Z01419 Encounter for gynecological examination (general) (routine) without abnormal findings: Secondary | ICD-10-CM

## 2016-01-23 DIAGNOSIS — F32A Depression, unspecified: Secondary | ICD-10-CM

## 2016-01-23 LAB — COMPREHENSIVE METABOLIC PANEL
ALT: 27 U/L (ref 6–29)
AST: 16 U/L (ref 10–35)
Albumin: 3.9 g/dL (ref 3.6–5.1)
Alkaline Phosphatase: 50 U/L (ref 33–130)
BUN: 18 mg/dL (ref 7–25)
CHLORIDE: 104 mmol/L (ref 98–110)
CO2: 25 mmol/L (ref 20–31)
CREATININE: 0.62 mg/dL (ref 0.50–1.05)
Calcium: 8.9 mg/dL (ref 8.6–10.4)
GLUCOSE: 99 mg/dL (ref 65–99)
Potassium: 4.1 mmol/L (ref 3.5–5.3)
SODIUM: 139 mmol/L (ref 135–146)
TOTAL PROTEIN: 6.2 g/dL (ref 6.1–8.1)
Total Bilirubin: 0.5 mg/dL (ref 0.2–1.2)

## 2016-01-23 LAB — LIPID PANEL
CHOLESTEROL: 141 mg/dL (ref 125–200)
HDL: 36 mg/dL — ABNORMAL LOW (ref >=46)
LDL CALC: 72 mg/dL (ref <130)
Total CHOL/HDL Ratio: 3.9 Ratio (ref <=5.0)
Triglycerides: 164 mg/dL — ABNORMAL HIGH (ref <150)
VLDL: 33 mg/dL — AB (ref <30)

## 2016-01-23 LAB — CBC WITH DIFFERENTIAL/PLATELET
BASOS PCT: 1 % (ref 0–1)
Basophils Absolute: 0.1 10*3/uL (ref 0.0–0.1)
EOS ABS: 0.1 10*3/uL (ref 0.0–0.7)
Eosinophils Relative: 2 % (ref 0–5)
HCT: 39.2 % (ref 36.0–46.0)
Hemoglobin: 13.4 g/dL (ref 12.0–15.0)
Lymphocytes Relative: 36 % (ref 12–46)
Lymphs Abs: 2.3 10*3/uL (ref 0.7–4.0)
MCH: 32 pg (ref 26.0–34.0)
MCHC: 34.2 g/dL (ref 30.0–36.0)
MCV: 93.6 fL (ref 78.0–100.0)
MONOS PCT: 8 % (ref 3–12)
MPV: 10.1 fL (ref 8.6–12.4)
Monocytes Absolute: 0.5 10*3/uL (ref 0.1–1.0)
NEUTROS PCT: 53 % (ref 43–77)
Neutro Abs: 3.4 10*3/uL (ref 1.7–7.7)
PLATELETS: 235 10*3/uL (ref 150–400)
RBC: 4.19 MIL/uL (ref 3.87–5.11)
RDW: 12.7 % (ref 11.5–15.5)
WBC: 6.5 10*3/uL (ref 4.0–10.5)

## 2016-01-23 MED ORDER — ESCITALOPRAM OXALATE 10 MG PO TABS: 10.0000 mg | ORAL_TABLET | Freq: Every day | ORAL | Status: DC

## 2016-01-23 MED ORDER — ESTRADIOL 0.5 MG PO TABS: 0.5000 mg | ORAL_TABLET | Freq: Every day | ORAL | Status: DC

## 2016-01-23 MED ORDER — OMEGA-3-ACID ETHYL ESTERS 1 G PO CAPS: 1.0000 g | ORAL_CAPSULE | Freq: Every day | ORAL | Status: DC

## 2016-01-23 NOTE — Progress Notes (Signed)
[employer]gie [name] 1966-05-27 [id number]    History:    Presents for annual exam.  2007 TAH with RSO for benign cyst. On estradiol 0.5 had try to wean off and does not feel as well off. Anxiety and depression doing well on Lexapro 10 mg will continue. Had been on Lovaza insurance stop tanning has been on high-dose omega-3 over-the-counter for elevated triglycerides. 2014 benign breast biopsy overdue for mammogram. Has not had a colonoscopy.   Past medical history, past surgical history, family history and social history were all reviewed and documented in the EPIC chart. 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 Father diabetes. Lee 14 doing well.  ROS:  A ROS was performed and pertinent positives and negatives are included.  Exam:  Filed Vitals:   01/23/16 0758  BP: 118/80    General appearance:  Normal Thyroid:  Symmetrical, normal in size, without palpable masses or nodularity. Respiratory  Auscultation:  Clear without wheezing or rhonchi Cardiovascular  Auscultation:  Regular rate, without rubs, murmurs or gallops  Edema/varicosities:  Not grossly evident Abdominal  Soft,nontender, without masses, guarding or rebound.  Liver/spleen:  No organomegaly noted  Hernia:  None appreciated  Skin  Inspection:  Grossly normal   Breasts: Examined lying and sitting.     Right: Without masses, retractions, discharge or axillary adenopathy.     Left: Without masses, retractions, discharge or axillary adenopathy. Gentitourinary   Inguinal/mons:  Normal without inguinal adenopathy  External genitalia:  Normal  BUS/Urethra/Skene's glands:  Normal  Vagina:  Normal  Cervix:  Uterus absent  Adnexa/parametria:     Rt: Without masses or tenderness.   Lt: Without masses or tenderness.  Anus and perineum: Normal  Digital rectal exam: Normal sphincter tone without palpated masses or tenderness  Assessment/Plan:  50 y.o. MWF G1 P1  for annual exam.  2007 TAH  with RSO on estradiol Anxiety and depression stable on Lexapro Elevated triglycerides with good relief salt on Lovaza  Plan: Reviewed importance of annual screening mammogram instructed to schedule overdue. Estradiol 0.5 prescription, proper use, slight risk for blood clots and breast cancer and strokes reviewed. Continue to work on increasing regular exercise, decreasing calories for weight loss. Reviewed importance of screening colonoscopy, had a negative sigmoidoscopy greater than 10 years ago will schedule. Lovaza 1 mg by mouth daily prescription, proper use given and reviewed. CBC, CMP, lipid panel, vitamin Shirlean Kelly [facility], 8:39 AM 01/23/2016

## 2016-01-23 NOTE — Addendum Note (Signed)
Addended by: Kem Parkinson on: 01/23/2016 09:08 AM   Modules accepted: Orders

## 2016-01-23 NOTE — Patient Instructions (Signed)

## 2016-01-24 LAB — VITAMIN D 25 HYDROXY (VIT D DEFICIENCY, FRACTURES): VIT D 25 HYDROXY: 51 ng/mL (ref 30–100)

## 2016-01-25 LAB — URINALYSIS W MICROSCOPIC + REFLEX CULTURE
Bilirubin Urine: NEGATIVE
CASTS: NONE SEEN [LPF]
GLUCOSE, UA: NEGATIVE
HGB URINE DIPSTICK: NEGATIVE
Ketones, ur: NEGATIVE
LEUKOCYTES UA: NEGATIVE
Nitrite: NEGATIVE
PROTEIN: NEGATIVE
RBC / HPF: NONE SEEN RBC/HPF (ref <=2)
SQUAMOUS EPITHELIAL / LPF: NONE SEEN [HPF] (ref <=5)
Specific Gravity, Urine: 1.021 (ref 1.001–1.035)
WBC, UA: NONE SEEN WBC/HPF (ref <=5)
YEAST: NONE SEEN [HPF]
pH: 6 (ref 5.0–8.0)

## 2016-01-29 LAB — URINE CULTURE: Colony Count: >=100000

## 2016-02-06 ENCOUNTER — Other Ambulatory Visit: Payer: Self-pay | Admitting: Women's Health

## 2016-02-06 MED ORDER — NITROFURANTOIN MONOHYD MACRO 100 MG PO CAPS: 100.0000 mg | ORAL_CAPSULE | Freq: Two times a day (BID) | ORAL | Status: DC

## 2016-11-23 ENCOUNTER — Encounter: Payer: Self-pay | Admitting: Women's Health

## 2017-01-27 ENCOUNTER — Other Ambulatory Visit: Payer: Self-pay | Admitting: Women's Health

## 2017-01-27 ENCOUNTER — Encounter: Payer: Self-pay | Admitting: Women's Health

## 2017-01-27 ENCOUNTER — Ambulatory Visit (INDEPENDENT_AMBULATORY_CARE_PROVIDER_SITE_OTHER): Payer: BLUE CROSS/BLUE SHIELD | Admitting: Women's Health

## 2017-01-27 VITALS — BP 114/78 | Ht 67.0 in | Wt 219.0 lb

## 2017-01-27 DIAGNOSIS — Z23 Encounter for immunization: Secondary | ICD-10-CM | POA: Diagnosis not present

## 2017-01-27 DIAGNOSIS — Z13220 Encounter for screening for lipoid disorders: Secondary | ICD-10-CM | POA: Diagnosis not present

## 2017-01-27 DIAGNOSIS — Z79890 Hormone replacement therapy (postmenopausal): Secondary | ICD-10-CM | POA: Diagnosis not present

## 2017-01-27 DIAGNOSIS — Z01419 Encounter for gynecological examination (general) (routine) without abnormal findings: Secondary | ICD-10-CM | POA: Diagnosis not present

## 2017-01-27 DIAGNOSIS — Z90710 Acquired absence of both cervix and uterus: Secondary | ICD-10-CM | POA: Insufficient documentation

## 2017-01-27 DIAGNOSIS — F3289 Other specified depressive episodes: Secondary | ICD-10-CM

## 2017-01-27 LAB — CBC WITH DIFFERENTIAL/PLATELET
Basophils Absolute: 0 cells/uL (ref 0–200)
Basophils Relative: 0 %
EOS PCT: 2 %
Eosinophils Absolute: 136 cells/uL (ref 15–500)
HEMATOCRIT: 40 % (ref 35.0–45.0)
Hemoglobin: 13.7 g/dL (ref 11.7–15.5)
LYMPHS PCT: 31 %
Lymphs Abs: 2108 cells/uL (ref 850–3900)
MCH: 32.4 pg (ref 27.0–33.0)
MCHC: 34.3 g/dL (ref 32.0–36.0)
MCV: 94.6 fL (ref 80.0–100.0)
MONO ABS: 544 {cells}/uL (ref 200–950)
MPV: 10.2 fL (ref 7.5–12.5)
Monocytes Relative: 8 %
NEUTROS PCT: 59 %
Neutro Abs: 4012 cells/uL (ref 1500–7800)
Platelets: 230 10*3/uL (ref 140–400)
RBC: 4.23 MIL/uL (ref 3.80–5.10)
RDW: 12.6 % (ref 11.0–15.0)
WBC: 6.8 10*3/uL (ref 3.8–10.8)

## 2017-01-27 LAB — COMPREHENSIVE METABOLIC PANEL
ALBUMIN: 4 g/dL (ref 3.6–5.1)
ALT: 16 U/L (ref 6–29)
AST: 15 U/L (ref 10–35)
Alkaline Phosphatase: 53 U/L (ref 33–130)
BUN: 14 mg/dL (ref 7–25)
CALCIUM: 9.2 mg/dL (ref 8.6–10.4)
CHLORIDE: 106 mmol/L (ref 98–110)
CO2: 27 mmol/L (ref 20–31)
Creat: 0.76 mg/dL (ref 0.50–1.05)
GLUCOSE: 104 mg/dL — AB (ref 65–99)
POTASSIUM: 4.3 mmol/L (ref 3.5–5.3)
Sodium: 138 mmol/L (ref 135–146)
Total Bilirubin: 0.4 mg/dL (ref 0.2–1.2)
Total Protein: 6.4 g/dL (ref 6.1–8.1)

## 2017-01-27 LAB — LIPID PANEL
Cholesterol: 157 mg/dL (ref <200)
HDL: 55 mg/dL (ref >50)
LDL CALC: 76 mg/dL (ref <100)
TRIGLYCERIDES: 131 mg/dL (ref <150)
Total CHOL/HDL Ratio: 2.9 Ratio (ref <5.0)
VLDL: 26 mg/dL (ref <30)

## 2017-01-27 MED ORDER — ESCITALOPRAM OXALATE 10 MG PO TABS: 10.0000 mg | ORAL_TABLET | Freq: Every day | ORAL | 4 refills | Status: DC

## 2017-01-27 MED ORDER — ESTRADIOL 0.5 MG PO TABS: 0.5000 mg | ORAL_TABLET | Freq: Every day | ORAL | 4 refills | Status: DC

## 2017-01-27 NOTE — Patient Instructions (Signed)

## 2017-01-27 NOTE — Progress Notes (Signed)
Angie [name] [birth year] [id number]    History:    Presents for annual exam.  2007 TAH with RSO for benign endometrioma on estradiol. Anxiety and depression stable on Lexapro. 2014 benign breast biopsy normal mammograms after. Has not had a screening colonoscopy. History of elevated cholesterol had been on Lovaza now taking fish oil supplement.  Past medical history, past surgical history, family history and social history were all reviewed and documented in the EPIC chart. 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 [name] 13 doing well. Mother hypertension, father diabetes.  ROS:  A ROS was performed and pertinent positives and negatives are included.  Exam:  Vitals:   01/27/17 0824  BP: 114/78  Weight: 219 lb (99.3 kg)  Height: 5\' 7"  (1.702 m)   Body mass index is 34.3 kg/m.   General appearance:  Normal Thyroid:  Symmetrical, normal in size, without palpable masses or nodularity. Respiratory  Auscultation:  Clear without wheezing or rhonchi Cardiovascular  Auscultation:  Regular rate, without rubs, murmurs or gallops  Edema/varicosities:  Not grossly evident Abdominal  Soft,nontender, without masses, guarding or rebound.  Liver/spleen:  No organomegaly noted  Hernia:  None appreciated  Skin  Inspection:  Grossly normal   Breasts: Examined lying and sitting.     Right: Without masses, retractions, discharge or axillary adenopathy.     Left: Without masses, retractions, discharge or axillary adenopathy. Gentitourinary   Inguinal/mons:  Normal without inguinal adenopathy  External genitalia:  Normal  BUS/Urethra/Skene's glands:  Normal  Vagina:  Normal  Cervix:  And uterus absent              Adnexa/parametria:     Rt: Without masses or tenderness.   Lt: Without masses or tenderness.  Anus and perineum: Normal  Digital rectal exam: Normal sphincter tone without palpated masses or tenderness  Assessment/Plan:  51 y.o. MWF G2 P1 for annual exam no  complaints.  2007 TAH with RSO for benign endometrioma on estradiol Anxiety depression stable on Lexapro Obesity  Plan: Estradiol 0.5 prescription, proper use given and reviewed slight risk for blood clots, strokes and breast cancer. Women's health initiative reviewed. Lexapro 10 mg by mouth daily prescription, proper use given and reviewed. Encouraged to continue Weight Watchers and increasing regular exercise. SBE's, continue annual screening 3-D mammogram. Vitamin D 2000 daily encouraged. Screening colonoscopy reviewed importance, [facility] information given instructed to schedule. CBC, CMP, lipid panel,    Angie ChallengerYOUNG,Angie [name] WHNP, 9:12 AM 01/27/2017

## 2017-01-27 NOTE — Addendum Note (Signed)
Addended by: Berna SpareASTILLO, Khyri Hinzman A on: 01/27/2017 04:50 PM   Modules accepted: Orders

## 2017-01-28 LAB — HEMOGLOBIN A1C
Hgb A1c MFr Bld: 5 % (ref <5.7)
MEAN PLASMA GLUCOSE: 97 mg/dL

## 2017-05-12 ENCOUNTER — Encounter: Payer: Self-pay | Admitting: Gynecology

## 2017-11-01 ENCOUNTER — Other Ambulatory Visit (INDEPENDENT_AMBULATORY_CARE_PROVIDER_SITE_OTHER): Payer: Self-pay

## 2017-11-01 MED ORDER — METHYLPREDNISOLONE 4 MG PO TABS: 4.0000 mg | ORAL_TABLET | Freq: Every day | ORAL | 0 refills | Status: DC

## 2017-11-30 ENCOUNTER — Other Ambulatory Visit (INDEPENDENT_AMBULATORY_CARE_PROVIDER_SITE_OTHER): Payer: Self-pay | Admitting: Orthopaedic Surgery

## 2017-11-30 MED ORDER — METAXALONE 800 MG PO TABS: 800.0000 mg | ORAL_TABLET | Freq: Three times a day (TID) | ORAL | 0 refills | Status: DC | PRN

## 2017-12-24 ENCOUNTER — Other Ambulatory Visit (INDEPENDENT_AMBULATORY_CARE_PROVIDER_SITE_OTHER): Payer: Self-pay | Admitting: Specialist

## 2017-12-24 NOTE — Telephone Encounter (Signed)
Rx refill

## 2018-01-18 DIAGNOSIS — H04123 Dry eye syndrome of bilateral lacrimal glands: Secondary | ICD-10-CM | POA: Diagnosis not present

## 2018-03-02 ENCOUNTER — Encounter: Payer: Self-pay | Admitting: Women's Health

## 2018-03-02 ENCOUNTER — Ambulatory Visit (INDEPENDENT_AMBULATORY_CARE_PROVIDER_SITE_OTHER): Payer: BLUE CROSS/BLUE SHIELD | Admitting: Women's Health

## 2018-03-02 VITALS — BP 110/78 | Ht 67.0 in | Wt 189.0 lb

## 2018-03-02 DIAGNOSIS — F3289 Other specified depressive episodes: Secondary | ICD-10-CM

## 2018-03-02 DIAGNOSIS — Z13220 Encounter for screening for lipoid disorders: Secondary | ICD-10-CM | POA: Diagnosis not present

## 2018-03-02 DIAGNOSIS — Z01419 Encounter for gynecological examination (general) (routine) without abnormal findings: Secondary | ICD-10-CM

## 2018-03-02 DIAGNOSIS — Z1231 Encounter for screening mammogram for malignant neoplasm of breast: Secondary | ICD-10-CM | POA: Diagnosis not present

## 2018-03-02 DIAGNOSIS — Z79890 Hormone replacement therapy (postmenopausal): Secondary | ICD-10-CM

## 2018-03-02 LAB — COMPREHENSIVE METABOLIC PANEL
AG Ratio: 1.9 (calc) (ref 1.0–2.5)
ALT: 17 U/L (ref 6–29)
AST: 17 U/L (ref 10–35)
Albumin: 4.4 g/dL (ref 3.6–5.1)
Alkaline phosphatase (APISO): 49 U/L (ref 33–130)
BUN: 17 mg/dL (ref 7–25)
CALCIUM: 9.7 mg/dL (ref 8.6–10.4)
CO2: 27 mmol/L (ref 20–32)
Chloride: 104 mmol/L (ref 98–110)
Creat: 0.72 mg/dL (ref 0.50–1.05)
GLUCOSE: 99 mg/dL (ref 65–99)
Globulin: 2.3 g/dL (calc) (ref 1.9–3.7)
Potassium: 4.5 mmol/L (ref 3.5–5.3)
SODIUM: 139 mmol/L (ref 135–146)
TOTAL PROTEIN: 6.7 g/dL (ref 6.1–8.1)
Total Bilirubin: 0.6 mg/dL (ref 0.2–1.2)

## 2018-03-02 LAB — CBC WITH DIFFERENTIAL/PLATELET
Basophils Absolute: 29 {cells}/uL (ref 0–200)
Basophils Relative: 0.5 %
Eosinophils Absolute: 110 {cells}/uL (ref 15–500)
Eosinophils Relative: 1.9 %
HCT: 41.6 % (ref 35.0–45.0)
Hemoglobin: 14.3 g/dL (ref 11.7–15.5)
Lymphs Abs: 2111 {cells}/uL (ref 850–3900)
MCH: 31.7 pg (ref 27.0–33.0)
MCHC: 34.4 g/dL (ref 32.0–36.0)
MCV: 92.2 fL (ref 80.0–100.0)
MPV: 10.8 fL (ref 7.5–12.5)
Monocytes Relative: 6.5 %
Neutro Abs: 3173 {cells}/uL (ref 1500–7800)
Neutrophils Relative %: 54.7 %
Platelets: 227 Thousand/uL (ref 140–400)
RBC: 4.51 Million/uL (ref 3.80–5.10)
RDW: 11.8 % (ref 11.0–15.0)
Total Lymphocyte: 36.4 %
WBC mixed population: 377 {cells}/uL (ref 200–950)
WBC: 5.8 Thousand/uL (ref 3.8–10.8)

## 2018-03-02 LAB — LIPID PANEL
Cholesterol: 206 mg/dL — ABNORMAL HIGH (ref <200)
HDL: 69 mg/dL (ref >50)
LDL CHOLESTEROL (CALC): 114 mg/dL — AB
NON-HDL CHOLESTEROL (CALC): 137 mg/dL — AB (ref <130)
Total CHOL/HDL Ratio: 3 (calc) (ref <5.0)
Triglycerides: 124 mg/dL (ref <150)

## 2018-03-02 MED ORDER — ESTRADIOL 0.5 MG PO TABS: 0.5000 mg | ORAL_TABLET | Freq: Every day | ORAL | 4 refills | Status: DC

## 2018-03-02 MED ORDER — ESCITALOPRAM OXALATE 10 MG PO TABS: 10.0000 mg | ORAL_TABLET | Freq: Every day | ORAL | 4 refills | Status: DC

## 2018-03-02 NOTE — Patient Instructions (Addendum)
Congratulations on healthier Angie [name] for Postmenopausal Women Menopause is a normal process in which your reproductive ability comes to an end. This process happens gradually over a span of months to years, usually between the ages of 54 and 9. Menopause is complete when you have missed 12 consecutive menstrual periods. It is important to talk with your health care provider about some of the most common conditions that affect postmenopausal women, such as heart disease, cancer, and bone loss (osteoporosis). Adopting a healthy lifestyle and getting preventive care can help to promote your health and wellness. Those actions can also lower your chances of developing some of these common conditions. What should I know about menopause? During menopause, you may experience a number of symptoms, such as:  Moderate-to-severe hot flashes.  Night sweats.  Decrease in sex drive.  Mood swings.  Headaches.  Tiredness.  Irritability.  Memory problems.  Insomnia.  Choosing to treat or not to treat menopausal changes is an individual decision that you make with your health care provider. What should I know about hormone replacement therapy and supplements? Hormone therapy products are effective for treating symptoms that are associated with menopause, such as hot flashes and night sweats. Hormone replacement carries certain risks, especially as you become older. If you are thinking about using estrogen or estrogen with progestin treatments, discuss the benefits and risks with your health care provider. What should I know about heart disease and stroke? Heart disease, heart attack, and stroke become more likely as you age. This may be due, in part, to the hormonal changes that your body experiences during menopause. These can affect how your body processes dietary fats, triglycerides, and cholesterol. Heart attack and stroke are both medical emergencies. There are many things that you  can do to help prevent heart disease and stroke:  Have your blood pressure checked at least every 1-2 years. High blood pressure causes heart disease and increases the risk of stroke.  If you are 54-39 years old, ask your health care provider if you should take aspirin to prevent a heart attack or a stroke.  Do not use any tobacco products, including cigarettes, chewing tobacco, or electronic cigarettes. If you need help quitting, ask your health care provider.  It is important to eat a healthy diet and maintain a healthy weight. ? Be sure to include plenty of vegetables, fruits, low-fat dairy products, and lean protein. ? Avoid eating foods that are high in solid fats, added sugars, or salt (sodium).  Get regular exercise. This is one of the most important things that you can do for your health. ? Try to exercise for at least 150 minutes each week. The type of exercise that you do should increase your heart rate and make you sweat. This is known as moderate-intensity exercise. ? Try to do strengthening exercises at least twice each week. Do these in addition to the moderate-intensity exercise.  Know your numbers.Ask your health care provider to check your cholesterol and your blood glucose. Continue to have your blood tested as directed by your health care provider.  What should I know about cancer screening? There are several types of cancer. Take the following steps to reduce your risk and to catch any cancer development as early as possible. Breast Cancer  Practice breast self-awareness. ? This means understanding how your breasts normally appear and feel. ? It also means doing regular breast self-exams. Let your health care provider know about any changes, no matter how small.  If you are 54 or older, have a clinician do a breast exam (clinical breast exam or CBE) every year. Depending on your age, family history, and medical history, it may be recommended that you also have a yearly  breast X-ray (mammogram).  If you have a family history of breast cancer, talk with your health care provider about genetic screening.  If you are at high risk for breast cancer, talk with your health care provider about having an MRI and a mammogram every year.  Breast cancer (BRCA) gene test is recommended for women who have family members with BRCA-related cancers. Results of the assessment will determine the need for genetic counseling and BRCA1 and for BRCA2 testing. BRCA-related cancers include these types: ? Breast. This occurs in males or females. ? Ovarian. ? Tubal. This may also be called fallopian tube cancer. ? Cancer of the abdominal or pelvic lining (peritoneal cancer). ? Prostate. ? Pancreatic.  Cervical, Uterine, and Ovarian Cancer Your health care provider may recommend that you be screened regularly for cancer of the pelvic organs. These include your ovaries, uterus, and vagina. This screening involves a pelvic exam, which includes checking for microscopic changes to the surface of your cervix (Pap test).  For women ages 21-65, health care providers may recommend a pelvic exam and a Pap test every three years. For women ages 54-65, they may recommend the Pap test and pelvic exam, combined with testing for human papilloma virus (HPV), every five years. Some types of HPV increase your risk of cervical cancer. Testing for HPV may also be done on women of any age who have unclear Pap test results.  Other health care providers may not recommend any screening for nonpregnant women who are considered low risk for pelvic cancer and have no symptoms. Ask your health care provider if a screening pelvic exam is right for you.  If you have had past treatment for cervical cancer or a condition that could lead to cancer, you need Pap tests and screening for cancer for at least 20 years after your treatment. If Pap tests have been discontinued for you, your risk factors (such as having a new  sexual partner) need to be reassessed to determine if you should start having screenings again. Some women have medical problems that increase the chance of getting cervical cancer. In these cases, your health care provider may recommend that you have screening and Pap tests more often.  If you have a family history of uterine cancer or ovarian cancer, talk with your health care provider about genetic screening.  If you have vaginal bleeding after reaching menopause, tell your health care provider.  There are currently no reliable tests available to screen for ovarian cancer.  Lung Cancer Lung cancer screening is recommended for adults 14-22 years old who are at high risk for lung cancer because of a history of smoking. A yearly low-dose CT scan of the lungs is recommended if you:  Currently smoke.  Have a history of at least 30 pack-years of smoking and you currently smoke or have quit within the past 15 years. A pack-year is smoking an average of one pack of cigarettes per day for one year.  Yearly screening should:  Continue until it has been 15 years since you quit.  Stop if you develop a health problem that would prevent you from having lung cancer treatment.  Colorectal Cancer  This type of cancer can be detected and can often be prevented.  Routine colorectal cancer  screening usually begins at age 48 and continues through age 60.  If you have risk factors for colon cancer, your health care provider may recommend that you be screened at an earlier age.  If you have a family history of colorectal cancer, talk with your health care provider about genetic screening.  Your health care provider may also recommend using home test kits to check for hidden blood in your stool.  A small camera at the end of a tube can be used to examine your colon directly (sigmoidoscopy or colonoscopy). This is done to check for the earliest forms of colorectal cancer.  Direct examination of the  colon should be repeated every 5-10 years until age 68. However, if early forms of precancerous polyps or small growths are found or if you have a family history or genetic risk for colorectal cancer, you may need to be screened more often.  Skin Cancer  Check your skin from head to toe regularly.  Monitor any moles. Be sure to tell your health care provider: ? About any new moles or changes in moles, especially if there is a change in a mole's shape or color. ? If you have a mole that is larger than the size of a pencil eraser.  If any of your family members has a history of skin cancer, especially at a young age, talk with your health care provider about genetic screening.  Always use sunscreen. Apply sunscreen liberally and repeatedly throughout the day.  Whenever you are outside, protect yourself by wearing long sleeves, pants, a wide-brimmed hat, and sunglasses.  What should I know about osteoporosis? Osteoporosis is a condition in which bone destruction happens more quickly than new bone creation. After menopause, you may be at an increased risk for osteoporosis. To help prevent osteoporosis or the bone fractures that can happen because of osteoporosis, the following is recommended:  If you are 58-5 years old, get at least 1,000 mg of calcium and at least 600 mg of vitamin D per day.  If you are older than age 11 but younger than age 73, get at least 1,200 mg of calcium and at least 600 mg of vitamin D per day.  If you are older than age 54, get at least 1,200 mg of calcium and at least 800 mg of vitamin D per day.  Smoking and excessive alcohol intake increase the risk of osteoporosis. Eat foods that are rich in calcium and vitamin D, and do weight-bearing exercises several times each week as directed by your health care provider. What should I know about how menopause affects my mental health? Depression may occur at any age, but it is more common as you become older. Common  symptoms of depression include:  Low or sad mood.  Changes in sleep patterns.  Changes in appetite or eating patterns.  Feeling an overall lack of motivation or enjoyment of activities that you previously enjoyed.  Frequent crying spells.  Talk with your health care provider if you think that you are experiencing depression. What should I know about immunizations? It is important that you get and maintain your immunizations. These include:  Tetanus, diphtheria, and pertussis (Tdap) booster vaccine.  Influenza every year before the flu season begins.  Pneumonia vaccine.  Shingles vaccine.  Your health care provider may also recommend other immunizations. This information is not intended to replace advice given to you by your health care provider. Make sure you discuss any questions you have with your health care  provider. Document Released: 02/05/2006 Document Revised: 07/03/2016 Document Reviewed: 09/17/2015 Elsevier Interactive Patient Education  2018 [employer].

## 2018-03-02 NOTE — Progress Notes (Signed)
Angie [name] Feb 02, 1966 [id number]    History:  52 y.o G2P1011,  presents for annual exam with no complaints. 2007 TAH with RSO for benign endometrioma on estradiol 0.5 mg. 2014 benign breast biopsy,  normal mammograms after.   Normal Pap history.  Flu vaccine, 07/28/2017.  Anxiety stable on  lexapro 10 MG, counseling in the past. Was on whole 30 diet, lost 35 lbs within one year. Still on the same diet, but added diary to her diet after 30 days. Has been taking magnesium for vertigo like symptoms which have resolved.    Past medical history, past surgical history, family history and social history were all reviewed and documented in the EPIC chart. Retired [employer], working at [employer] and [employer]. Angie [name] age 52 doing well. Mother hypercholesteremia, father diabetes.  ROS:  A ROS was performed and pertinent positives and negatives are included.  Exam:  Vitals:   03/02/18 0832  BP: 110/78  Weight: 189 lb (85.7 kg)  Height: 5\' 7"  (1.702 m)   Body mass index is 29.6 kg/m.   General appearance:  Normal Thyroid:  Symmetrical, normal in size, without palpable masses or nodularity. Respiratory  Auscultation:  Clear without wheezing or rhonchi Cardiovascular  Auscultation:  Regular rate, without rubs, murmurs or gallops  Edema/varicosities:  Not grossly evident Abdominal  Soft,nontender, without masses, guarding or rebound.  Liver/spleen:  No organomegaly noted  Hernia:  None appreciated  Skin  Inspection:  Grossly normal   Breasts: Examined lying and sitting.     Right: Without masses, retractions, discharge or axillary adenopathy.     Left: Without masses, retractions, discharge or axillary adenopathy. Gentitourinary   Inguinal/mons:  Normal without inguinal adenopathy  External genitalia:  Normal  BUS/Urethra/Skene's glands:  Normal  Vagina:  Normal  Cervix:  And uterus absent Adnexa/parametria:     Rt: Without masses or tenderness.   Lt: Without masses or tenderness.  Anus  and perineum: Normal  Digital rectal exam: Normal sphincter tone without palpated masses or tenderness  Assessment/Plan:  52 y.o. MWF G2 P1 for annual exam with no complaints.   2007 TAH with RSO for benign endometrioma on estradiol Anxiety/ depression stable on Lexapro  Plan: Estradiol 0.5 prescription, proper use given and reviewed slight risk for blood clots, strokes and breast cancer. Women's health initiative reviewed. Lexapro 10 mg by mouth daily prescription, proper use given and reviewed. Encouraged to continue her diet and regular exercise. SBE's, continue annual screening 3-D mammogram. Vitamin D 2000 daily encouraged. Screening colonoscopy reviewed importance, [facility] information given instructed to schedule. CBC, CMP, lipid panel ordered.    Angie [name] [facility]WHNP, 9:19 AM 03/02/2018

## 2018-04-21 ENCOUNTER — Other Ambulatory Visit: Payer: Self-pay | Admitting: Women's Health

## 2018-04-21 DIAGNOSIS — Z79890 Hormone replacement therapy (postmenopausal): Secondary | ICD-10-CM

## 2018-04-21 DIAGNOSIS — F3289 Other specified depressive episodes: Secondary | ICD-10-CM

## 2018-10-24 ENCOUNTER — Ambulatory Visit (INDEPENDENT_AMBULATORY_CARE_PROVIDER_SITE_OTHER): Payer: BLUE CROSS/BLUE SHIELD | Admitting: Family Medicine

## 2018-10-24 ENCOUNTER — Encounter: Payer: Self-pay | Admitting: Family Medicine

## 2018-10-24 VITALS — BP 115/76 | HR 68 | Temp 97.8°F | Resp 16 | Ht 66.75 in | Wt 200.0 lb

## 2018-10-24 DIAGNOSIS — R140 Abdominal distension (gaseous): Secondary | ICD-10-CM

## 2018-10-24 DIAGNOSIS — Z23 Encounter for immunization: Secondary | ICD-10-CM

## 2018-10-24 DIAGNOSIS — R1011 Right upper quadrant pain: Secondary | ICD-10-CM

## 2018-10-24 DIAGNOSIS — Z7689 Persons encountering health services in other specified circumstances: Secondary | ICD-10-CM

## 2018-10-24 DIAGNOSIS — E669 Obesity, unspecified: Secondary | ICD-10-CM | POA: Insufficient documentation

## 2018-10-24 LAB — COMPREHENSIVE METABOLIC PANEL
ALBUMIN: 4.3 g/dL (ref 3.5–5.2)
ALK PHOS: 50 U/L (ref 39–117)
ALT: 15 U/L (ref 0–35)
AST: 16 U/L (ref 0–37)
BILIRUBIN TOTAL: 0.4 mg/dL (ref 0.2–1.2)
BUN: 20 mg/dL (ref 6–23)
CALCIUM: 9.4 mg/dL (ref 8.4–10.5)
CO2: 28 meq/L (ref 19–32)
CREATININE: 0.68 mg/dL (ref 0.40–1.20)
Chloride: 106 mEq/L (ref 96–112)
GFR: 95.79 mL/min (ref >60.00)
Glucose, Bld: 105 mg/dL — ABNORMAL HIGH (ref 70–99)
Potassium: 4.6 mEq/L (ref 3.5–5.1)
Sodium: 140 mEq/L (ref 135–145)
Total Protein: 6.5 g/dL (ref 6.0–8.3)

## 2018-10-24 LAB — CBC WITH DIFFERENTIAL/PLATELET
BASOS ABS: 0 10*3/uL (ref 0.0–0.1)
Basophils Relative: 0.4 % (ref 0.0–3.0)
EOS PCT: 2.1 % (ref 0.0–5.0)
Eosinophils Absolute: 0.1 10*3/uL (ref 0.0–0.7)
HEMATOCRIT: 40.7 % (ref 36.0–46.0)
HEMOGLOBIN: 13.8 g/dL (ref 12.0–15.0)
LYMPHS PCT: 36.7 % (ref 12.0–46.0)
Lymphs Abs: 2.1 10*3/uL (ref 0.7–4.0)
MCHC: 33.9 g/dL (ref 30.0–36.0)
MCV: 95 fl (ref 78.0–100.0)
MONOS PCT: 6.2 % (ref 3.0–12.0)
Monocytes Absolute: 0.4 10*3/uL (ref 0.1–1.0)
Neutro Abs: 3.1 10*3/uL (ref 1.4–7.7)
Neutrophils Relative %: 54.6 % (ref 43.0–77.0)
Platelets: 194 10*3/uL (ref 150.0–400.0)
RBC: 4.29 Mil/uL (ref 3.87–5.11)
RDW: 12.6 % (ref 11.5–15.5)
WBC: 5.8 10*3/uL (ref 4.0–10.5)

## 2018-10-24 LAB — H. PYLORI ANTIBODY, IGG: H Pylori IgG: NEGATIVE

## 2018-10-24 LAB — LIPASE: LIPASE: 23 U/L (ref 11.0–59.0)

## 2018-10-24 NOTE — Progress Notes (Signed)
Patient ID: Angie [name], female  DOB: [birth year], 53 y.o.   MRN: [id number] Patient Care Team    Relationship Specialty Notifications Start End  Ma Hillock, DO PCP - General Family Medicine  10/24/18   [name], NP Nurse Practitioner Obstetrics and Gynecology  10/24/18   Mcarthur Rossetti, MD Consulting Physician Orthopedic Surgery  10/24/18   Ladene Artist, MD Consulting Physician Gastroenterology  10/24/18     Chief Complaint  Patient presents with  . Establish Care    RUQ pain    Subjective:  Angie [name] is a 52 y.o.  female present for new patient establishment. All past medical history, surgical history, allergies, family history, immunizations, medications and social history were updated in the electronic medical record today. All recent labs, ED visits and hospitalizations within the last year were reviewed.  RUQ pain: Pt reports she has experienced 2 weeks gassy and bloating that is worse in the morning and improves as the day goes on. The discomfort feels like "pressure" in her RUQ. She is worried about her liver and hep c. 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 She denies fever, chills, night sweats, unintended weight loss. She reports no bowl changes, routine BM daily. No melena or heart burn. Discomfort is not associated with meals. They only changes to meds or diet is she stopped taking her fish oil over that time (ran out).  PMH of gallbladder, appendix and hysterectomy with right SO. Never colonoscopy. No Fhx IBD or cancer.   Depression screen PHQ 2/9 10/24/2018  Decreased Interest 0  Down, Depressed, Hopeless 0  PHQ - 2 Score 0   No flowsheet data found.  Current Exercise Habits: Structured exercise class, Type of exercise: Other - see comments;strength training/weights(rowing), Time (Minutes): 60, Frequency (Times/Week): 3, Weekly Exercise (Minutes/Week): 180, Intensity: Moderate Exercise limited by: cardiac  condition(s) Fall Risk  10/24/2018  Falls in the past year? No    Immunization History  Administered Date(s) Administered  . Influenza,inj,Quad PF,6+ Mos 01/23/2016, 01/27/2017, 10/24/2018    No exam data present  Past Medical History:  Diagnosis Date  . Arthritis   . Depression    Allergies  Allergen Reactions  . Vicodin [Hydrocodone-Acetaminophen] Palpitations   Past Surgical History:  Procedure Laterality Date  . ABDOMINAL HYSTERECTOMY  2007   TAH,.RSO  . APPENDECTOMY  1986  . BREAST BIOPSY Right 01/2014   benign  . CARPAL TUNNEL RELEASE  1997  . CHOLECYSTECTOMY  2006  . EXPLORATORY LAP-TAH,RSO  2007   ENDOMETRIOMA  . OOPHORECTOMY     RSO  . RESECTOSCOPIC POLYPECTOMY/BTL  10/28/05  . SEPTOPLASTY  1990  . TUBAL LIGATION  10/28/05   Family History  Problem Relation Age of Onset  . Diabetes Father   . Hypertension Maternal Aunt   . Heart disease Maternal Aunt   . Hypertension Maternal Grandmother   . Heart disease Maternal Grandmother   . Hyperlipidemia Mother    Social History   Socioeconomic History  . Marital status: Married    Spouse name: Not on file  . Number of children: Not on file  . Years of education: Not on file  . Highest education level: Not on file  Occupational History  . Not on file  Social Needs  . Financial resource strain: Not on file  . Food insecurity:    Worry: Not on file    Inability: Not on file  . Transportation  needs:    Medical: Not on file    Non-medical: Not on file  Tobacco Use  . Smoking status: Never Smoker  . Smokeless tobacco: Never Used  Substance and Sexual Activity  . Alcohol use: No  . Drug use: No  . Sexual activity: Yes    Birth control/protection: Surgical    Comment: HYSTERECTOMY  Lifestyle  . Physical activity:    Days per week: Not on file    Minutes per session: Not on file  . Stress: Not on file  Relationships  . Social connections:    Talks on phone: Not on file    Gets together: Not on  file    Attends religious service: Not on file    Active member of club or organization: Not on file    Attends meetings of clubs or organizations: Not on file    Relationship status: Not on file  . Intimate partner violence:    Fear of current or ex partner: Not on file    Emotionally abused: Not on file    Physically abused: Not on file    Forced sexual activity: Not on file  Other Topics Concern  . Not on file  Social History Narrative  . Not on file   Allergies as of 10/24/2018      Reactions   Vicodin [hydrocodone-acetaminophen] Palpitations      Medication List        Accurate as of 10/24/18  2:51 PM. Always use your most recent med list.          ASPIRIN PO Take 1 tablet by mouth daily.   CALTRATE 600+D PO Take 1 tablet by mouth daily.   escitalopram 10 MG tablet Commonly known as:  LEXAPRO TAKE 1 TABLET BY MOUTH ONCE DAILY   estradiol 0.5 MG tablet Commonly known as:  ESTRACE TAKE 1 TABLET BY MOUTH ONCE DAILY   FISH OIL PO Take 1 tablet by mouth daily.   MAGNESIUM PO Take 1 tablet by mouth daily.   meloxicam 15 MG tablet Commonly known as:  MOBIC TAKE 1 TABLET BY MOUTH TWICE DAILY AS NEEDED   metaxalone 800 MG tablet Commonly known as:  SKELAXIN Take 1 tablet (800 mg total) by mouth 3 (three) times daily as needed for muscle spasms.   multivitamin capsule Take 1 capsule by mouth daily.   VITAMIN B COMPLEX PO Take 1 tablet by mouth daily.   VITAMIN D PO Take 1 tablet by mouth daily.       All past medical history, surgical history, allergies, family history, immunizations andmedications were updated in the EMR today and reviewed under the history and medication portions of their EMR.    No results found for this or any previous visit (from the past 2160 hour(s)).  No results found.   ROS: 14 pt review of systems performed and negative (unless mentioned in an HPI)  Objective: BP 115/76 (BP Location: Left Arm, Patient Position:  Sitting, Cuff Size: Large)   Pulse 68   Temp 97.8 F (36.6 C) (Oral)   Resp 16   Ht 5' 6.75" (1.695 m)   Wt 200 lb (90.7 kg)   SpO2 98%   BMI 31.56 kg/m  Gen: Afebrile. No acute distress. Nontoxic in appearance, well-developed, well-nourished,  Obese, pleasant caucasian female.  HENT: AT. [place]. MMM, no oral lesions. no Cough on exam, no hoarseness on exam. Eyes:Pupils Equal Round Reactive to light, Extraocular movements intact,  Conjunctiva without redness, discharge or icterus.  Neck/lymp/endocrine: Supple,no lymphadenopathy, no thyromegaly CV: RRR no murmur, no edema, +2/4 P posterior tibialis pulses. no carotid bruits. No JVD. Chest: CTAB, no wheeze, rhonchi or crackles.  Abd: Soft. flat. ND. Mild tenderness with deep palpation right flank/abd. . BS present. no Masses palpated. No hepatosplenomegaly. No rebound tenderness or guarding. Skin: no rashes, purpura or petechiae. Warm and well-perfused. Skin intact. Neuro/Msk:  Normal gait. PERLA. EOMi. Alert. Oriented x3.   Psych: Normal affect, dress and demeanor. Normal speech. Normal thought content and judgment.  Assessment/plan: Angie [name] is a 52 y.o. female present for est care. RUQ-flank pain/Generalized bloating - discussed options with her today. Only med change is she is not taking the fish oil last few weeks. Her pain is more right flank/mid abd. No masses. She has concerns of infectious causes of liver from her h/o as  A surgical PA (retired). - Never smoker. - If labs do not indicate cause or abnl chemistry consider [place] of abdomen ([place] lx).  - Comp Met (CMET) - Lipase - H. pylori antibody, IgG - CBC w/Diff - Hepatitis C Antibody - HIV antibody (with reflex) - f/u dependent on labs. She has GI est appt end of NOV with Dr. Fuller Plan for colonoscopy (1st)  Immunization due - Flu Vaccine QUAD 6+ mos PF IM (Fluarix Quad PF)   Return in about 2 weeks (around 11/07/2018), or if symptoms worsen or fail to improve.  Greater  than 30 minutes spent with patient, >50% of time spent face to face   Note is dictated utilizing voice recognition software. Although note has been proof read prior to signing, occasional typographical errors still can be missed. If any questions arise, please do not hesitate to call for verification.  Electronically signed by: Howard Pouch, DO [place]

## 2018-10-24 NOTE — Patient Instructions (Signed)
I will call you with lab results once available and possibly proceed to Ultrasound if needed.    Please help [place] help you:  We are honored you have chosen [facility] for your Primary Care home. Below you will find basic instructions that you may need to access in the future. Please help [place] help you by reading the instructions, which cover many of the frequent questions we experience.   Prescription refills and request:  -In order to allow more efficient response time, please call your pharmacy for all refills. They will forward the request electronically to [place]. This allows for the quickest possible response. Request left on a nurse line can take longer to refill, since these are checked as time allows between office patients and other phone calls.  - refill request can take up to 3-5 working days to complete.  - If request is sent electronically and request is appropiate, it is usually completed in 1-2 business days.  - all patients will need to be seen routinely for all chronic medical conditions requiring prescription medications (see follow-up below). If you are overdue for follow up on your condition, you will be asked to make an appointment and we will call in enough medication to cover you until your appointment (up to 30 days).  - all controlled substances will require a face to face visit to request/refill.  - if you desire your prescriptions to go through a new pharmacy, and have an active script at original pharmacy, you will need to call your pharmacy and have scripts transferred to new pharmacy. This is completed between the pharmacy locations and not by your provider.    Results: If any images or labs were ordered, it can take up to 1 week to get results depending on the test ordered and the lab/facility running and resulting the test. - Normal or stable results, which do not need further discussion, may be released to your mychart immediately with attached note to you. A call may  not be generated for normal results. Please make certain to sign up for mychart. If you have questions on how to activate your mychart you can call the front office.  - If your results need further discussion, our office will attempt to contact you via phone, and if unable to reach you after 2 attempts, we will release your abnormal result to your mychart with instructions.  - All results will be automatically released in mychart after 1 week.  - Your provider will provide you with explanation and instruction on all relevant material in your results. Please keep in mind, results and labs may appear confusing or abnormal to the untrained eye, but it does not mean they are actually abnormal for you personally. If you have any questions about your results that are not covered, or you desire more detailed explanation than what was provided, you should make an appointment with your provider to do so.   Our office handles many outgoing and incoming calls daily. If we have not contacted you within 1 week about your results, please check your mychart to see if there is a message first and if not, then contact our office.  In helping with this matter, you help decrease call volume, and therefore allow [place] to be able to respond to patients needs more efficiently.   Acute office visits (sick visit):  An acute visit is intended for a new problem and are scheduled in shorter time slots to allow schedule openings for patients  with new problems. This is the appropriate visit to discuss a new problem. Problems will not be addressed by phone call or Echart message. Appointment is needed if requesting treatment. In order to provide you with excellent quality medical care with proper time for you to explain your problem, have an exam and receive treatment with instructions, these appointments should be limited to one new problem per visit. If you experience a new problem, in which you desire to be addressed, please make an  acute office visit, we save openings on the schedule to accommodate you. Please do not save your new problem for any other type of visit, let us take care of it properly and quickly for you.   Follow up visits:  Depending on your condition(s) your provider will need to see you routinely in order to provide you with quality care and prescribe medication(s). Most chronic conditions (Example: hypertension, Diabetes, depression/anxiety... etc), require visits a couple times a year. Your provider will instruct you on proper follow up for your personal medical conditions and history. Please make certain to make follow up appointments for your condition as instructed. Failing to do so could result in lapse in your medication treatment/refills. If you request a refill, and are overdue to be seen on a condition, we will always provide you with a 30 day script (once) to allow you time to schedule.    Medicare wellness (well visit): - we have a wonderful Nurse Selena Batten), that will meet with you and provide you will yearly medicare wellness visits. These visits should occur yearly (can not be scheduled less than 1 calendar year apart) and cover preventive health, immunizations, advance directives and screenings you are entitled to yearly through your medicare benefits. Do not miss out on your entitled benefits, this is when medicare will pay for these benefits to be ordered for you.  These are strongly encouraged by your provider and is the appropriate type of visit to make certain you are up to date with all preventive health benefits. If you have not had your medicare wellness exam in the last 12 months, please make certain to schedule one by calling the office and schedule your medicare wellness with Selena Batten as soon as possible.   Yearly physical (well visit):  - Adults are recommended to be seen yearly for physicals. Check with your insurance and date of your last physical, most insurances require one calendar year  between physicals. Physicals include all preventive health topics, screenings, medical exam and labs that are appropriate for gender/age and history. You may have fasting labs needed at this visit. This is a well visit (not a sick visit), new problems should not be covered during this visit (see acute visit).  - Pediatric patients are seen more frequently when they are younger. Your provider will advise you on well child visit timing that is appropriate for your their age. - This is not a medicare wellness visit. Medicare wellness exams do not have an exam portion to the visit. Some medicare companies allow for a physical, some do not allow a yearly physical. If your medicare allows a yearly physical you can schedule the medicare wellness with our nurse Selena Batten and have your physical with your provider after, on the same day. Please check with insurance for your full benefits.   Late Policy/No Shows:  - all new patients should arrive 15-30 minutes earlier than appointment to allow [place] time  to  obtain all personal demographics,  insurance information and for you  to complete office paperwork. - All established patients should arrive 10-15 minutes earlier than appointment time to update all information and be checked in .  - In our best efforts to run on time, if you are late for your appointment you will be asked to either reschedule or if able, we will work you back into the schedule. There will be a wait time to work you back in the schedule,  depending on availability.  - If you are unable to make it to your appointment as scheduled, please call 24 hours ahead of time to allow [place] to fill the time slot with someone else who needs to be seen. If you do not cancel your appointment ahead of time, you may be charged a no show fee.

## 2018-10-25 ENCOUNTER — Telehealth: Payer: Self-pay | Admitting: Family Medicine

## 2018-10-25 DIAGNOSIS — R109 Unspecified abdominal pain: Secondary | ICD-10-CM

## 2018-10-25 LAB — HEPATITIS C ANTIBODY
Hepatitis C Ab: NONREACTIVE
SIGNAL TO CUT-OFF: 0.01 (ref <1.00)

## 2018-10-25 LAB — HIV ANTIBODY (ROUTINE TESTING W REFLEX): HIV: NONREACTIVE

## 2018-10-25 NOTE — Telephone Encounter (Signed)
Please inform patient the following information: Her labs are all normal. Hep C negative. Liver enzymes normal. Kidney function normal. I did order the [place] ( [facility] [facility]). They will call her to schedule.

## 2018-10-25 NOTE — Telephone Encounter (Signed)
Left detailed message with results and instructions on patient voice mail per DPR 

## 2018-10-31 ENCOUNTER — Ambulatory Visit (INDEPENDENT_AMBULATORY_CARE_PROVIDER_SITE_OTHER): Payer: BLUE CROSS/BLUE SHIELD

## 2018-10-31 DIAGNOSIS — R109 Unspecified abdominal pain: Secondary | ICD-10-CM

## 2018-10-31 DIAGNOSIS — R1011 Right upper quadrant pain: Secondary | ICD-10-CM

## 2018-11-07 ENCOUNTER — Ambulatory Visit: Payer: BLUE CROSS/BLUE SHIELD | Admitting: Family Medicine

## 2018-11-14 ENCOUNTER — Encounter: Payer: Self-pay | Admitting: Gastroenterology

## 2018-11-14 ENCOUNTER — Ambulatory Visit (AMBULATORY_SURGERY_CENTER): Payer: Self-pay

## 2018-11-14 ENCOUNTER — Other Ambulatory Visit: Payer: Self-pay

## 2018-11-14 VITALS — Ht 67.0 in | Wt 203.6 lb

## 2018-11-14 DIAGNOSIS — Z1211 Encounter for screening for malignant neoplasm of colon: Secondary | ICD-10-CM

## 2018-11-14 MED ORDER — NA SULFATE-K SULFATE-MG SULF 17.5-3.13-1.6 GM/177ML PO SOLN: 1.0000 | Freq: Once | ORAL | 0 refills | Status: AC

## 2018-11-14 NOTE — Progress Notes (Signed)
No egg or soy allergy known to patient  No issues with past sedation with any surgeries  or procedures, no intubation problems  No diet pills per patient No home 02 use per patient  No blood thinners per patient  Pt denies issues with constipation  No A fib or A flutter  EMMI video sent to pt's e mail  

## 2018-11-28 ENCOUNTER — Encounter: Payer: Self-pay | Admitting: Gastroenterology

## 2018-11-28 ENCOUNTER — Ambulatory Visit (AMBULATORY_SURGERY_CENTER): Payer: BLUE CROSS/BLUE SHIELD | Admitting: Gastroenterology

## 2018-11-28 VITALS — BP 105/50 | HR 47 | Temp 97.8°F | Resp 10 | Ht 67.0 in | Wt 203.0 lb

## 2018-11-28 DIAGNOSIS — Z1211 Encounter for screening for malignant neoplasm of colon: Secondary | ICD-10-CM

## 2018-11-28 MED ORDER — SODIUM CHLORIDE 0.9 % IV SOLN: 500.0000 mL | Freq: Once | INTRAVENOUS | Status: DC

## 2018-11-28 NOTE — Progress Notes (Signed)
PT taken to PACU. Monitors in place. VSS. Report given to RN. 

## 2018-11-28 NOTE — Progress Notes (Signed)
Pt's states no medical or surgical changes since previsit or office visit. 

## 2018-11-28 NOTE — Patient Instructions (Signed)
Impression/Recommendations:  Repeat colonoscopy in 10 years for screening.  Resume previous diet. Continue present medications.  YOU HAD AN ENDOSCOPIC PROCEDURE TODAY AT THE [facility]:   Refer to the procedure report that was given to you for any specific questions about what was found during the examination.  If the procedure report does not answer your questions, please call your gastroenterologist to clarify.  If you requested that your care partner not be given the details of your procedure findings, then the procedure report has been included in a sealed envelope for you to review at your convenience later.  YOU SHOULD EXPECT: Some feelings of bloating in the abdomen. Passage of more gas than usual.  Walking can help get rid of the air that was put into your GI tract during the procedure and reduce the bloating. If you had a lower endoscopy (such as a colonoscopy or flexible sigmoidoscopy) you may notice spotting of blood in your stool or on the toilet paper. If you underwent a bowel prep for your procedure, you may not have a normal bowel movement for a few days.  Please Note:  You might notice some irritation and congestion in your nose or some drainage.  This is from the oxygen used during your procedure.  There is no need for concern and it should clear up in a day or so.  SYMPTOMS TO REPORT IMMEDIATELY:   Following lower endoscopy (colonoscopy or flexible sigmoidoscopy):  Excessive amounts of blood in the stool  Significant tenderness or worsening of abdominal pains  Swelling of the abdomen that is new, acute  Fever of 100F or higher For urgent or emergent issues, a gastroenterologist can be reached at any hour by calling [phone].   DIET:  We do recommend a small meal at first, but then you may proceed to your regular diet.  Drink plenty of fluids but you should avoid alcoholic beverages for 24 hours.  ACTIVITY:  You should plan to take it easy for the rest  of today and you should NOT DRIVE or use heavy machinery until tomorrow (because of the sedation medicines used during the test).    FOLLOW UP: Our staff will call the number listed on your records the next business day following your procedure to check on you and address any questions or concerns that you may have regarding the information given to you following your procedure. If we do not reach you, we will leave a message.  However, if you are feeling well and you are not experiencing any problems, there is no need to return our call.  We will assume that you have returned to your regular daily activities without incident.  If any biopsies were taken you will be contacted by phone or by letter within the next 1-3 weeks.  Please call us at [phone] if you have not heard about the biopsies in 3 weeks.    SIGNATURES/CONFIDENTIALITY: You and/or your care partner have signed paperwork which will be entered into your electronic medical record.  These signatures attest to the fact that that the information above on your After Visit Summary has been reviewed and is understood.  Full responsibility of the confidentiality of this discharge information lies with you and/or your care-partner.

## 2018-11-28 NOTE — Op Note (Signed)
[facility] Patient Name: Angie [name] Procedure Date: 11/28/2018 10:45 AM MRN: [id number] Endoscopist: Meryl DareMalcolm T Linsey Arteaga , MD Age: 52 Referring MD:  Date of Birth: 24-Dec-1966 Gender: Female Account #: [id number] Procedure:                Colonoscopy Indications:              Screening for colorectal malignant neoplasm Medicines:                Monitored Anesthesia Care Procedure:                Pre-Anesthesia Assessment:                           - Prior to the procedure, a History and Physical                            was performed, and patient medications and                            allergies were reviewed. The patient's tolerance of                            previous anesthesia was also reviewed. The risks                            and benefits of the procedure and the sedation                            options and risks were discussed with the patient.                            All questions were answered, and informed consent                            was obtained. Prior Anticoagulants: The patient has                            taken no previous anticoagulant or antiplatelet                            agents. ASA Grade Assessment: II - A patient with                            mild systemic disease. After reviewing the risks                            and benefits, the patient was deemed in                            satisfactory condition to undergo the procedure.                           After obtaining informed consent, the colonoscope  was passed under direct vision. Throughout the                            procedure, the patient's blood pressure, pulse, and                            oxygen saturations were monitored continuously. The                            Colonoscope was introduced through the anus and                            advanced to the the cecum, identified by                            appendiceal orifice and  ileocecal valve. The                            ileocecal valve, appendiceal orifice, and rectum                            were photographed. The quality of the bowel                            preparation was excellent. The colonoscopy was                            performed without difficulty. The patient tolerated                            the procedure well. Scope In: 10:55:39 AM Scope Out: 11:10:53 AM Scope Withdrawal Time: 0 hours 11 minutes 59 seconds  Total Procedure Duration: 0 hours 15 minutes 14 seconds  Findings:                 The perianal and digital rectal examinations were                            normal.                           The entire examined colon appeared normal on direct                            and retroflexion views. Complications:            No immediate complications. Estimated blood loss:                            None. Estimated Blood Loss:     Estimated blood loss: none. Impression:               - The entire examined colon is normal on direct and                            retroflexion views.                           -  No specimens collected. Recommendation:           - Repeat colonoscopy in 10 years for screening                            purposes.                           - Patient has a contact number available for                            emergencies. The signs and symptoms of potential                            delayed complications were discussed with the                            patient. Return to normal activities tomorrow.                            Written discharge instructions were provided to the                            patient.                           - Resume previous diet.                           - Continue present medications. Meryl Dare, MD 11/28/2018 11:13:24 AM This report has been signed electronically.

## 2018-11-29 ENCOUNTER — Telehealth: Payer: Self-pay

## 2018-11-29 NOTE — Telephone Encounter (Signed)
  Follow up Call-  Call back number 11/28/2018  Post procedure Call Back phone  # [phone]  Permission to leave phone message Yes  Some recent data might be hidden     Patient questions:  Do you have a fever, pain , or abdominal swelling? No. Pain Score  0 *  Have you tolerated food without any problems? Yes.    Have you been able to return to your normal activities? Yes.    Do you have any questions about your discharge instructions: Diet   No. Medications  No. Follow up visit  No.  Do you have questions or concerns about your Care? No.  Actions: * If pain score is 4 or above: No action needed, pain <4.

## 2019-03-02 DIAGNOSIS — H04123 Dry eye syndrome of bilateral lacrimal glands: Secondary | ICD-10-CM | POA: Diagnosis not present

## 2019-03-25 IMAGING — US US ABDOMEN COMPLETE
1 series · 14 of 25 positions shown · non-contrast
Comparison: CT scan of June 17, 2006. Ultrasound February 17, 2005.

CLINICAL DATA: Right upper quadrant abdominal pain.

EXAM:
ABDOMEN ULTRASOUND COMPLETE

[Series 1: us abdomen complete · 0.21mm/px · 14 of 58 slices shown]
[im 1/58]
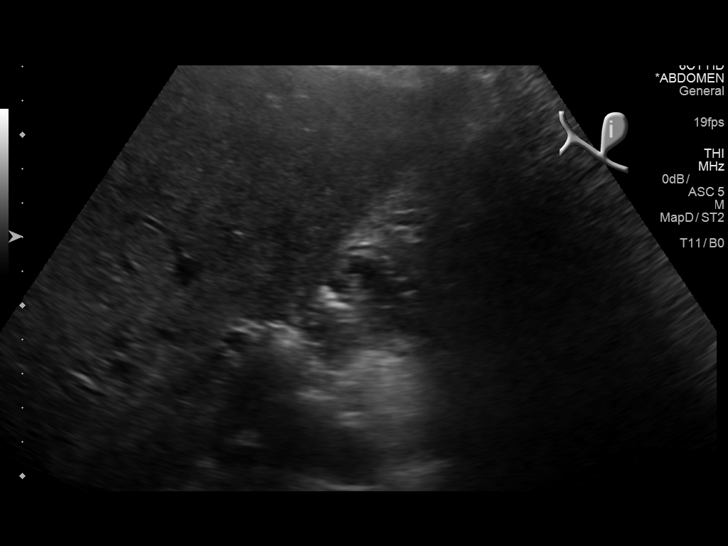
[im 5/58]
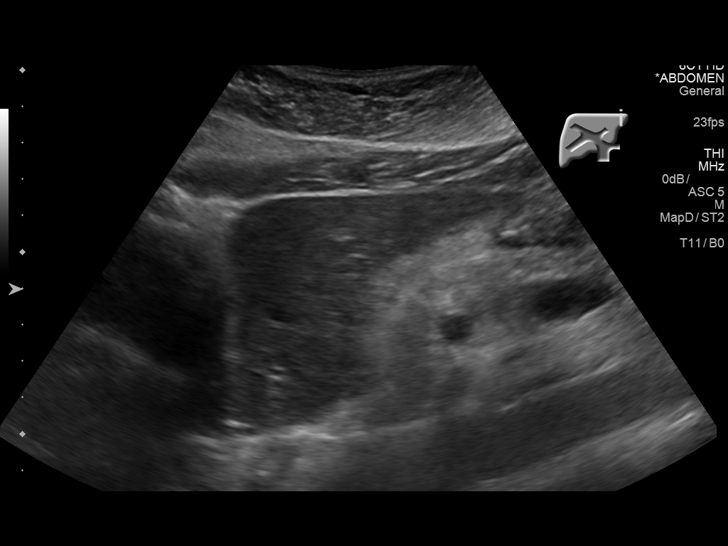
[im 10/58]
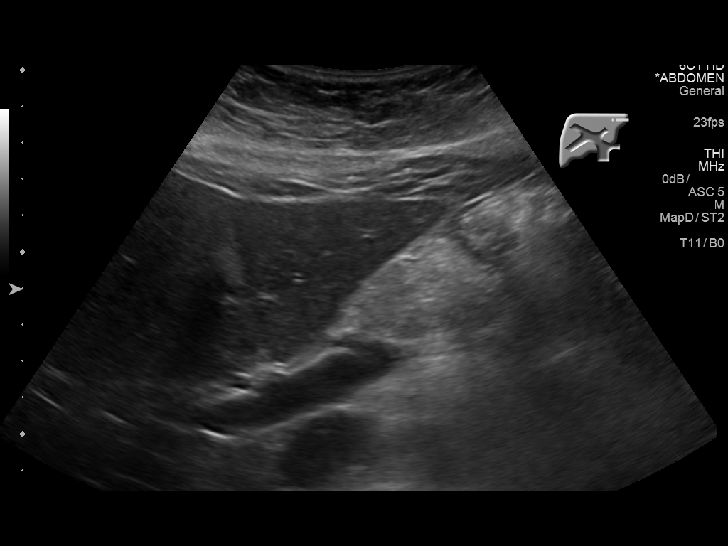
[im 15/58]
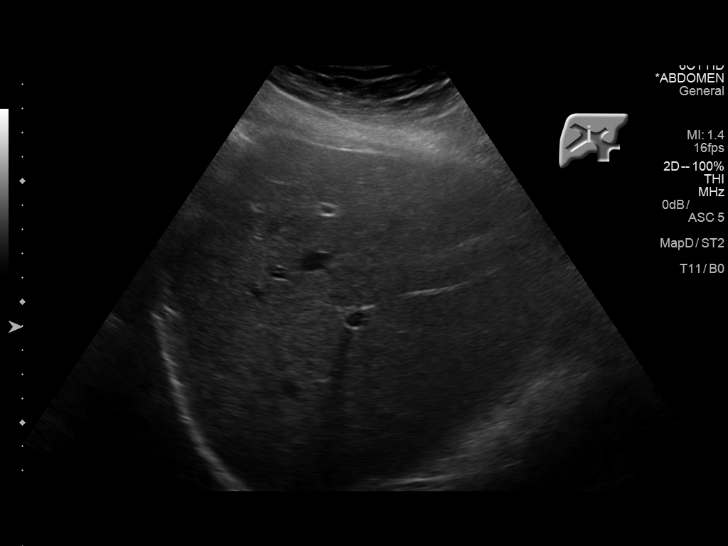
[im 20/58]
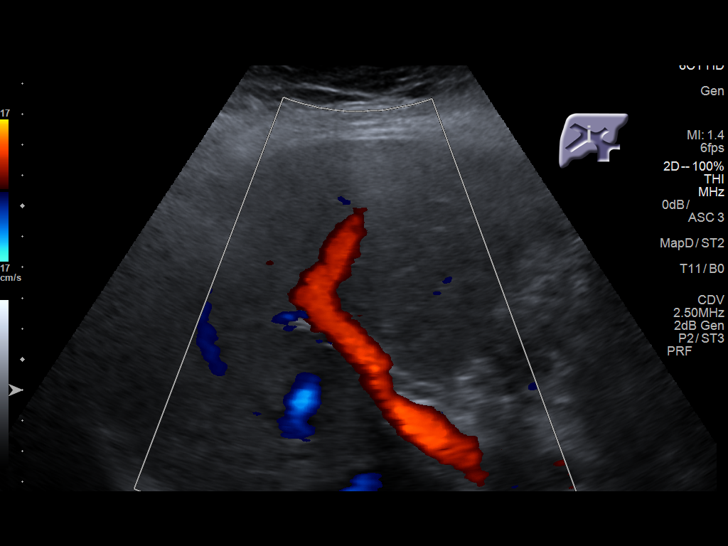
[im 22/58]
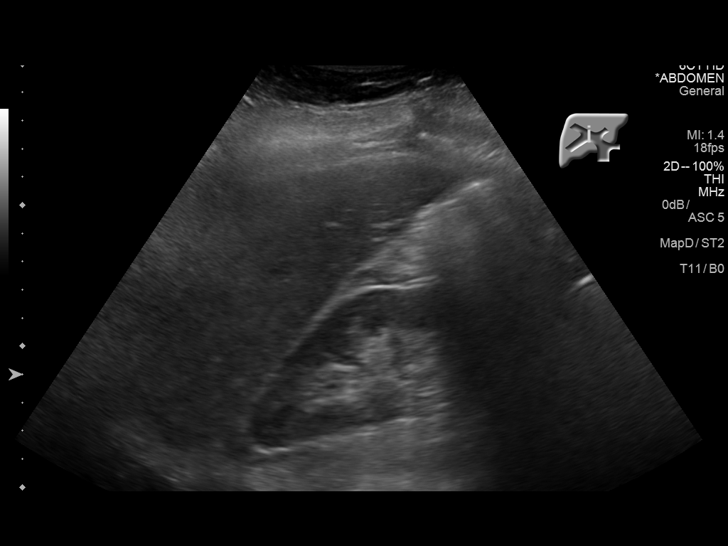
[im 27/58]
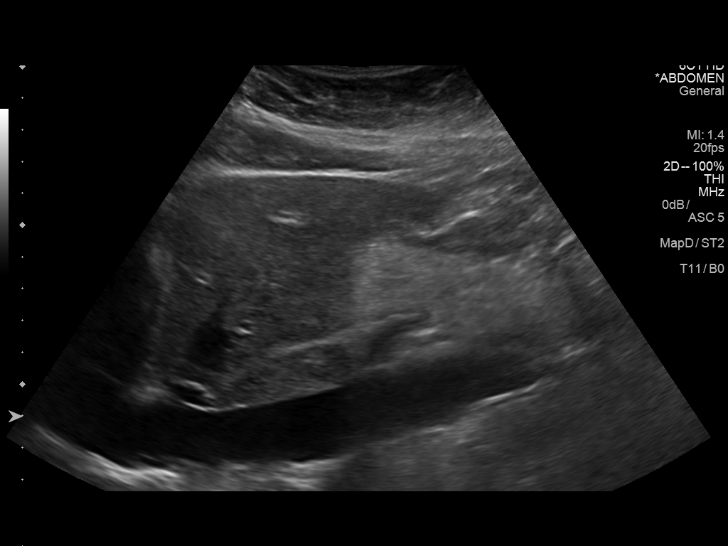
[im 31/58]
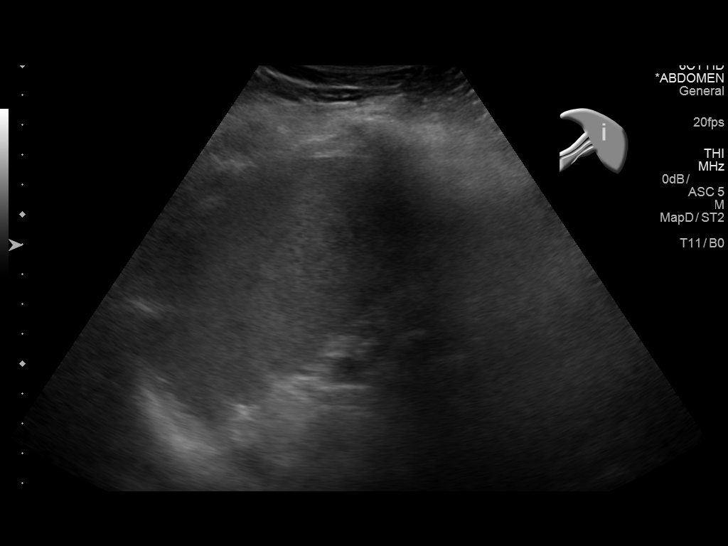
[im 36/58]
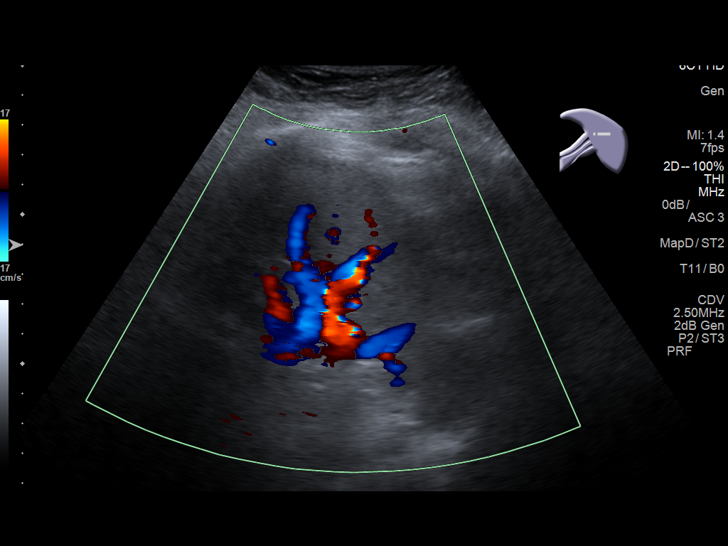
[im 39/58]
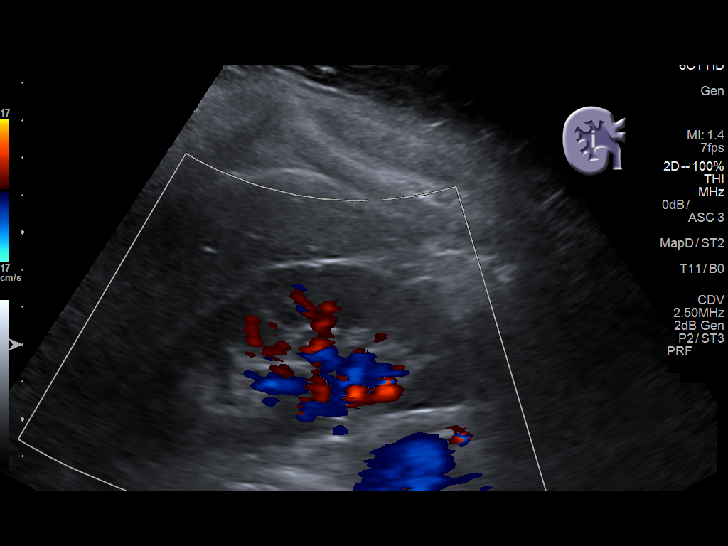
[im 43/58]
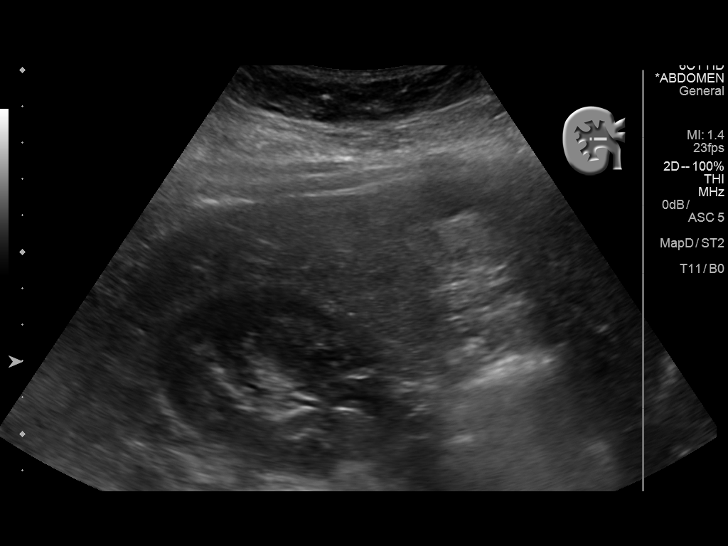
[im 48/58]
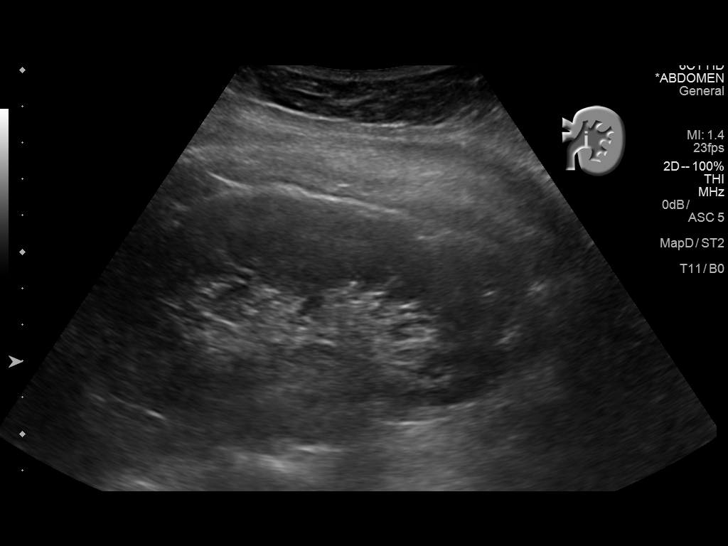
[im 53/58]
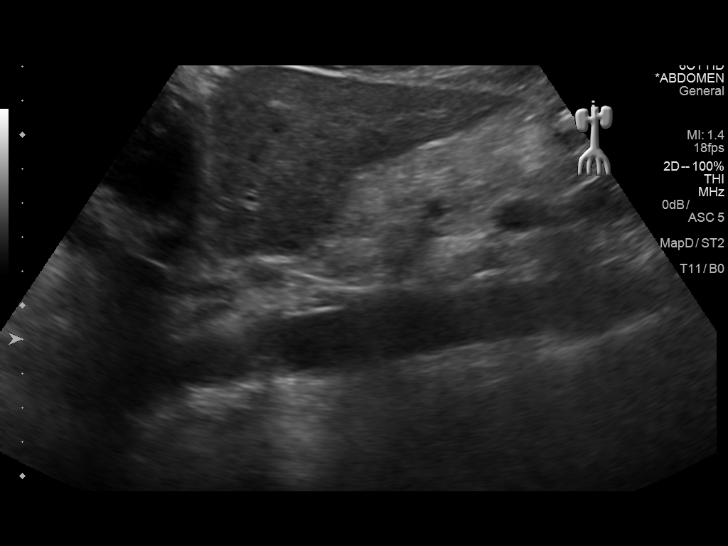
[im 58/58]
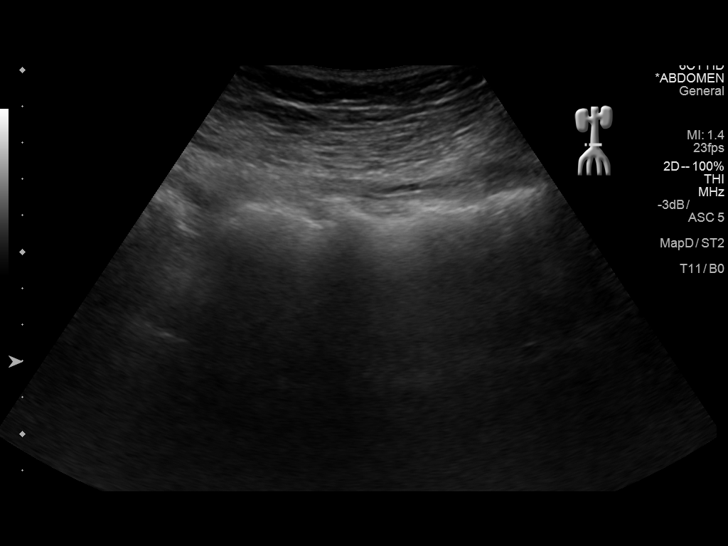

[14 of 25 positions shown; findings below may reference images not displayed]

FINDINGS: Gallbladder: Status post cholecystectomy.

Common bile duct: Diameter: 3 mm which is within normal limits.

Liver: No focal lesion identified. Within normal limits in
parenchymal echogenicity. Portal vein is patent on color Doppler
imaging with normal direction of blood flow towards the liver.

IVC: No abnormality visualized.

Pancreas: Visualized portion unremarkable.

Spleen: Size and appearance within normal limits.

Right Kidney: Length: 12 cm. Echogenicity within normal limits. No
mass or hydronephrosis visualized.

Left Kidney: Length: 12 cm. Echogenicity within normal limits. No
mass or hydronephrosis visualized.

Abdominal aorta: No aneurysm visualized.

Other findings: None.
IMPRESSION: Status post cholecystectomy. No other abnormality seen in the right
upper quadrant of the abdomen.

## 2019-06-20 ENCOUNTER — Other Ambulatory Visit (INDEPENDENT_AMBULATORY_CARE_PROVIDER_SITE_OTHER): Payer: Self-pay | Admitting: Specialist

## 2019-06-20 MED ORDER — MELOXICAM 15 MG PO TABS: 15.0000 mg | ORAL_TABLET | Freq: Two times a day (BID) | ORAL | 2 refills | Status: DC | PRN

## 2019-07-14 ENCOUNTER — Other Ambulatory Visit: Payer: Self-pay | Admitting: Women's Health

## 2019-07-14 DIAGNOSIS — F3289 Other specified depressive episodes: Secondary | ICD-10-CM

## 2019-07-14 DIAGNOSIS — Z79890 Hormone replacement therapy (postmenopausal): Secondary | ICD-10-CM

## 2019-07-14 NOTE — Telephone Encounter (Signed)
Left Angie [name] a message to call and schedule CE since overdue since March.

## 2019-07-14 NOTE — Telephone Encounter (Signed)
Per Rosemarie Ax annual exam scheduled 08/14/19.

## 2019-08-11 ENCOUNTER — Other Ambulatory Visit: Payer: Self-pay

## 2019-08-14 ENCOUNTER — Encounter: Payer: Self-pay | Admitting: Women's Health

## 2019-08-14 ENCOUNTER — Other Ambulatory Visit: Payer: Self-pay

## 2019-08-14 ENCOUNTER — Ambulatory Visit (INDEPENDENT_AMBULATORY_CARE_PROVIDER_SITE_OTHER): Payer: BC Managed Care – PPO | Admitting: Women's Health

## 2019-08-14 VITALS — BP 118/74 | Ht 67.0 in | Wt 196.0 lb

## 2019-08-14 DIAGNOSIS — Z13820 Encounter for screening for osteoporosis: Secondary | ICD-10-CM

## 2019-08-14 DIAGNOSIS — Z01419 Encounter for gynecological examination (general) (routine) without abnormal findings: Secondary | ICD-10-CM

## 2019-08-14 DIAGNOSIS — Z13220 Encounter for screening for lipoid disorders: Secondary | ICD-10-CM | POA: Diagnosis not present

## 2019-08-14 DIAGNOSIS — Z79890 Hormone replacement therapy (postmenopausal): Secondary | ICD-10-CM

## 2019-08-14 DIAGNOSIS — E559 Vitamin D deficiency, unspecified: Secondary | ICD-10-CM

## 2019-08-14 DIAGNOSIS — F3289 Other specified depressive episodes: Secondary | ICD-10-CM

## 2019-08-14 MED ORDER — ESCITALOPRAM OXALATE 10 MG PO TABS: 10.0000 mg | ORAL_TABLET | Freq: Every day | ORAL | 4 refills | Status: DC

## 2019-08-14 MED ORDER — ESTRADIOL 0.5 MG PO TABS: 0.5000 mg | ORAL_TABLET | Freq: Every day | ORAL | 4 refills | Status: DC

## 2019-08-14 NOTE — Progress Notes (Signed)
Angie [name] 1966-08-08 [id number]    History:    Presents for annual exam.  2007 TAH with RSO for endometrioma.  On estradiol 0.5 daily with rare hot flash and good sleep.  Normal Pap and mammogram history/[facility], benign breast biopsy 2014.  2019- colonoscopy.  Anxiety and depression stable on Lexapro.  Past medical history, past surgical history, family history and social history were all reviewed and documented in the EPIC chart.  Retired PA works in a Teacher, adult education.  Mother hypercholesteremia, father diabetes.  Had lost over 30 pounds has put about 10 pounds back on but is now working to get weight back off.  Angie [name] 15 rising senior doing well.  ROS:  A ROS was performed and pertinent positives and negatives are included.  Exam:  Vitals:   08/14/19 1124  BP: 118/74  Weight: 196 lb (88.9 kg)  Height: 5\' 7"  (1.702 m)   Body mass index is 30.7 kg/m.   General appearance:  Normal Thyroid:  Symmetrical, normal in size, without palpable masses or nodularity. Respiratory  Auscultation:  Clear without wheezing or rhonchi Cardiovascular  Auscultation:  Regular rate, without rubs, murmurs or gallops  Edema/varicosities:  Not grossly evident Abdominal  Soft,nontender, without masses, guarding or rebound.  Liver/spleen:  No organomegaly noted  Hernia:  None appreciated  Skin  Inspection:  Grossly normal   Breasts: Examined lying and sitting.     Right: Without masses, retractions, discharge or axillary adenopathy.     Left: Without masses, retractions, discharge or axillary adenopathy. Gentitourinary   Inguinal/mons:  Normal without inguinal adenopathy  External genitalia:  Normal  BUS/Urethra/Skene's glands:  Normal  Vagina:  Normal  Cervix: And uterus absent   Adnexa/parametria:     Rt: Without masses or tenderness.   Lt: Without masses or tenderness.  Anus and perineum: Normal  Digital rectal exam: Normal sphincter tone without palpated masses or tenderness  Assessment/Plan:  53  y.o. MWF G2, P1 for annual exam with no complaints.  2007 TAH with [facility] for endometrioma on estradiol Anxiety/depression stable on Lexapro  Plan: Return to office for DEXA and fasting for CBC, CMP, lipid panel and vitamin D.  SBEs, annual screening mammogram has had at [facility] instructed to have [facility] send report to our office.,  Aware of importance of regular cardio type and balance type exercise, calcium rich foods, vitamin D 2000 daily encouraged.    Angie [name] [facility], 12:05 PM 08/14/2019

## 2019-08-14 NOTE — Patient Instructions (Signed)

## 2019-08-31 ENCOUNTER — Other Ambulatory Visit: Payer: BC Managed Care – PPO

## 2020-04-08 DIAGNOSIS — H04123 Dry eye syndrome of bilateral lacrimal glands: Secondary | ICD-10-CM | POA: Diagnosis not present

## 2020-10-22 ENCOUNTER — Other Ambulatory Visit: Payer: Self-pay

## 2020-10-22 DIAGNOSIS — F3289 Other specified depressive episodes: Secondary | ICD-10-CM

## 2020-10-22 DIAGNOSIS — Z79890 Hormone replacement therapy (postmenopausal): Secondary | ICD-10-CM

## 2020-10-22 MED ORDER — ESTRADIOL 0.5 MG PO TABS: 0.5000 mg | ORAL_TABLET | Freq: Every day | ORAL | 0 refills | Status: DC

## 2020-10-22 MED ORDER — ESCITALOPRAM OXALATE 10 MG PO TABS: 10.0000 mg | ORAL_TABLET | Freq: Every day | ORAL | 0 refills | Status: DC

## 2020-10-22 NOTE — Telephone Encounter (Signed)
Past due for annual exam since August. Is scheduled with TW for 10/28/20,

## 2020-10-28 ENCOUNTER — Other Ambulatory Visit: Payer: Self-pay

## 2020-10-28 ENCOUNTER — Encounter: Payer: Self-pay | Admitting: Nurse Practitioner

## 2020-10-28 ENCOUNTER — Ambulatory Visit (INDEPENDENT_AMBULATORY_CARE_PROVIDER_SITE_OTHER): Payer: BC Managed Care – PPO | Admitting: Nurse Practitioner

## 2020-10-28 VITALS — BP 110/70 | Ht 67.0 in | Wt 209.0 lb

## 2020-10-28 DIAGNOSIS — Z01419 Encounter for gynecological examination (general) (routine) without abnormal findings: Secondary | ICD-10-CM

## 2020-10-28 DIAGNOSIS — Z79890 Hormone replacement therapy (postmenopausal): Secondary | ICD-10-CM | POA: Diagnosis not present

## 2020-10-28 DIAGNOSIS — Z90710 Acquired absence of both cervix and uterus: Secondary | ICD-10-CM | POA: Diagnosis not present

## 2020-10-28 DIAGNOSIS — Z9079 Acquired absence of other genital organ(s): Secondary | ICD-10-CM | POA: Diagnosis not present

## 2020-10-28 DIAGNOSIS — Z833 Family history of diabetes mellitus: Secondary | ICD-10-CM

## 2020-10-28 MED ORDER — ESTRADIOL 0.5 MG PO TABS: 0.5000 mg | ORAL_TABLET | Freq: Every day | ORAL | 2 refills | Status: DC

## 2020-10-28 NOTE — Patient Instructions (Signed)

## 2020-10-28 NOTE — Progress Notes (Signed)
   Angie [name] December 24, 1966 [id number]   History:  54 y.o. G2P1011 presents for annual exam. 2007 TAH [facility] for endometrioma. On Estradiol 0.5 mg daily, doing well on this. Normal pap and mammogram history.   Gynecologic History No LMP recorded. Patient has had a hysterectomy.   Contraception: status post hysterectomy Last Pap: 09/24/2011. Results were: normal Last mammogram: 2019. Results were: normal Last colonoscopy: 2019  Past medical history, past surgical history, family history and social history were all reviewed and documented in the EPIC chart.  ROS:  A ROS was performed and pertinent positives and negatives are included.  Exam:  Vitals:   10/28/20 1112  BP: 110/70  Weight: 209 lb (94.8 kg)  Height: 5\' 7"  (1.702 m)   Body mass index is 32.73 kg/m.  General appearance:  Normal Thyroid:  Symmetrical, normal in size, without palpable masses or nodularity. Respiratory  Auscultation:  Clear without wheezing or rhonchi Cardiovascular  Auscultation:  Regular rate, without rubs, murmurs or gallops  Edema/varicosities:  Not grossly evident Abdominal  Soft,nontender, without masses, guarding or rebound.  Liver/spleen:  No organomegaly noted  Hernia:  None appreciated  Skin  Inspection:  Grossly normal   Breasts: Examined lying and sitting.   Right: Without masses, retractions, discharge or axillary adenopathy.   Left: Without masses, retractions, discharge or axillary adenopathy. Gentitourinary   Inguinal/mons:  Normal without inguinal adenopathy  External genitalia:  Normal  BUS/Urethra/Skene's glands:  Normal  Vagina:  Normal  Cervix:  Absent  Uterus:  Absent  Adnexa/parametria:     Rt: Without masses or tenderness.   Lt: Without masses or tenderness.  Anus and perineum: Normal  Digital rectal exam: Normal sphincter tone without palpated masses or tenderness  Assessment/Plan:  54 y.o. G2P1011 for annual exam.   Well female exam with routine gynecological  exam - Plan: CBC with Differential/Platelet, Comprehensive metabolic panel, Lipid panel. Education provided on SBEs, importance of preventative screenings, current guidelines, high calcium diet, regular exercise, and multivitamin daily.   History of total abdominal hysterectomy - 2007 for endometrioma  History of unilateral salpingectomy - Right  Hormone replacement therapy (HRT) - Estradiol 0.5 mg daily with good relief of hot flashes and mood changes. Discussed slight risks for blood clots, heart attack, stroke, and breast cancer. She would like to continue. Refill x 1 year provided.   Family history of diabetes mellitus - Plan: Hemoglobin A1c  Screening for cervical cancer - Normal pap history. Will repeat pap next year at 10-year interval.   Screening for breast cancer - Normal mammogram history. Cyst aspiration many years ago. Overdue for mammogram, scheduled today. Normal breast exam today.   Screening for colon cancer - Will repeat at GI's recommended interval.       07-31-1983 [facility], 11:29 AM 10/28/2020

## 2020-10-29 LAB — CBC WITH DIFFERENTIAL/PLATELET
Absolute Monocytes: 410 cells/uL (ref 200–950)
Basophils Absolute: 19 cells/uL (ref 0–200)
Basophils Relative: 0.3 %
Eosinophils Absolute: 141 cells/uL (ref 15–500)
Eosinophils Relative: 2.2 %
HCT: 42.3 % (ref 35.0–45.0)
Hemoglobin: 14.5 g/dL (ref 11.7–15.5)
Lymphs Abs: 2189 cells/uL (ref 850–3900)
MCH: 32.4 pg (ref 27.0–33.0)
MCHC: 34.3 g/dL (ref 32.0–36.0)
MCV: 94.6 fL (ref 80.0–100.0)
MPV: 10.9 fL (ref 7.5–12.5)
Monocytes Relative: 6.4 %
Neutro Abs: 3642 cells/uL (ref 1500–7800)
Neutrophils Relative %: 56.9 %
Platelets: 243 10*3/uL (ref 140–400)
RBC: 4.47 10*6/uL (ref 3.80–5.10)
RDW: 11.9 % (ref 11.0–15.0)
Total Lymphocyte: 34.2 %
WBC: 6.4 10*3/uL (ref 3.8–10.8)

## 2020-10-29 LAB — COMPREHENSIVE METABOLIC PANEL
AG Ratio: 1.7 (calc) (ref 1.0–2.5)
ALT: 16 U/L (ref 6–29)
AST: 15 U/L (ref 10–35)
Albumin: 4.3 g/dL (ref 3.6–5.1)
Alkaline phosphatase (APISO): 51 U/L (ref 37–153)
BUN: 17 mg/dL (ref 7–25)
CO2: 26 mmol/L (ref 20–32)
Calcium: 9.5 mg/dL (ref 8.6–10.4)
Chloride: 104 mmol/L (ref 98–110)
Creat: 0.76 mg/dL (ref 0.50–1.05)
Globulin: 2.5 g/dL (calc) (ref 1.9–3.7)
Glucose, Bld: 99 mg/dL (ref 65–99)
Potassium: 4.3 mmol/L (ref 3.5–5.3)
Sodium: 138 mmol/L (ref 135–146)
Total Bilirubin: 0.6 mg/dL (ref 0.2–1.2)
Total Protein: 6.8 g/dL (ref 6.1–8.1)

## 2020-10-29 LAB — HEMOGLOBIN A1C
Hgb A1c MFr Bld: 5.4 % of total Hgb (ref <5.7)
Mean Plasma Glucose: 108 (calc)
eAG (mmol/L): 6 (calc)

## 2020-10-29 LAB — LIPID PANEL
Cholesterol: 203 mg/dL — ABNORMAL HIGH (ref <200)
HDL: 62 mg/dL (ref >=50)
LDL Cholesterol (Calc): 110 mg/dL (calc) — ABNORMAL HIGH
Non-HDL Cholesterol (Calc): 141 mg/dL (calc) — ABNORMAL HIGH (ref <130)
Total CHOL/HDL Ratio: 3.3 (calc) (ref <5.0)
Triglycerides: 192 mg/dL — ABNORMAL HIGH (ref <150)

## 2020-12-17 ENCOUNTER — Telehealth: Payer: Self-pay

## 2020-12-17 MED ORDER — MELOXICAM 15 MG PO TABS: ORAL_TABLET | ORAL | 1 refills | Status: DC

## 2020-12-17 NOTE — Telephone Encounter (Signed)
Patient notified this was submitted.

## 2020-12-17 NOTE — Telephone Encounter (Signed)
Patient called stated she is having a terrible flare up of her arthritis in her hands-she has been very short staffed at work and her hands are really bothering her. Wanting to know if she could have a prescription for Mobic sent to her pharmacy-[facility] in [place]? Please advise thanks.

## 2020-12-17 NOTE — Telephone Encounter (Signed)
Okay to call in Mobic.  Plan for follow-up at next available appointment.  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  Mobic 15 mg #30 with one refill.  Thanks

## 2020-12-17 NOTE — Addendum Note (Signed)
Addended byPrescott Parma on: 12/17/2020 03:28 PM   Modules accepted: Orders

## 2021-01-25 ENCOUNTER — Other Ambulatory Visit: Payer: Self-pay | Admitting: Nurse Practitioner

## 2021-01-25 DIAGNOSIS — F3289 Other specified depressive episodes: Secondary | ICD-10-CM

## 2021-01-28 ENCOUNTER — Other Ambulatory Visit: Payer: Self-pay | Admitting: Nurse Practitioner

## 2021-01-28 DIAGNOSIS — Z79890 Hormone replacement therapy (postmenopausal): Secondary | ICD-10-CM

## 2021-01-29 NOTE — Telephone Encounter (Signed)
Take for Anxiety/depression stable on Lexapro, last filled on 10/22/20 with 0 refills  Had annual exam on 10/28/20

## 2021-02-28 ENCOUNTER — Other Ambulatory Visit: Payer: Self-pay | Admitting: Orthopedic Surgery

## 2021-10-28 ENCOUNTER — Other Ambulatory Visit: Payer: Self-pay | Admitting: *Deleted

## 2021-10-28 DIAGNOSIS — F3289 Other specified depressive episodes: Secondary | ICD-10-CM

## 2021-10-28 DIAGNOSIS — Z79890 Hormone replacement therapy (postmenopausal): Secondary | ICD-10-CM

## 2021-10-28 MED ORDER — ESTRADIOL 0.5 MG PO TABS: ORAL_TABLET | ORAL | 0 refills | Status: DC

## 2021-10-28 MED ORDER — ESCITALOPRAM OXALATE 10 MG PO TABS: ORAL_TABLET | ORAL | 0 refills | Status: DC

## 2021-10-28 NOTE — Telephone Encounter (Addendum)
RX refill request Estradiol and Lexapro Scheduled upcoming annual exam 12/01/2021 I will route to Provider for approval

## 2021-12-01 ENCOUNTER — Ambulatory Visit: Payer: BC Managed Care – PPO | Admitting: Nurse Practitioner

## 2021-12-01 NOTE — Progress Notes (Deleted)
   Jeannia Tatro [birth year] [id number]   History:  55 y.o. G2P1011 presents for annual exam. Postmenopausal - on ERT. S/P 2007 TAH RSO for endometrioma. Normal pap and mammogram history.   Gynecologic History No LMP recorded. Patient has had a hysterectomy.   Contraception: status post hysterectomy Sexually active: ***  Health maintenance Last Pap: 09/24/2011. Results were: Normal Last mammogram: 10/29/2019. Results were: Normal Last colonoscopy: 2019. Results were: Normal, 10-year recall Last Dexa: Not indicated  Past medical history, past surgical history, family history and social history were all reviewed and documented in the EPIC chart.  ROS:  A ROS was performed and pertinent positives and negatives are included.  Exam:  There were no vitals filed for this visit.  There is no height or weight on file to calculate BMI.  General appearance:  Normal Thyroid:  Symmetrical, normal in size, without palpable masses or nodularity. Respiratory  Auscultation:  Clear without wheezing or rhonchi Cardiovascular  Auscultation:  Regular rate, without rubs, murmurs or gallops  Edema/varicosities:  Not grossly evident Abdominal  Soft,nontender, without masses, guarding or rebound.  Liver/spleen:  No organomegaly noted  Hernia:  None appreciated  Skin  Inspection:  Grossly normal   Breasts: Examined lying and sitting.   Right: Without masses, retractions, discharge or axillary adenopathy.   Left: Without masses, retractions, discharge or axillary adenopathy. Genitourinary   Inguinal/mons:  Normal without inguinal adenopathy  External genitalia:  Normal appearing vulva with no masses, tenderness, or lesions  BUS/Urethra/Skene's glands:  Normal  Vagina:  Normal appearing with normal color and discharge, no lesions  Cervix:  Absent  Uterus:  Absent  Adnexa/parametria:     Rt: Normal in size, without masses or tenderness.   Lt: Normal in size, without masses or tenderness.  Anus  and perineum: Normal  Digital rectal exam: Normal sphincter tone without palpated masses or tenderness  Patient informed chaperone available to be present for breast and pelvic exam. Patient has requested no chaperone to be present. Patient has been advised what will be completed during breast and pelvic exam.   Assessment/Plan:  55 y.o. G2P1011 for annual exam.     Screening for cervical cancer - Normal pap history. No longer screening per guidelines.    Screening for breast cancer - Normal mammogram history. Normal breast exam today.   Screening for colon cancer - 2018 colonoscopy. Will repeat at GI's recommended interval.   Return in 1 year for annual.     Olivia Mackie [facility], 2:01 PM 12/01/2021

## 2021-12-03 ENCOUNTER — Encounter: Payer: Self-pay | Admitting: Nurse Practitioner

## 2021-12-11 ENCOUNTER — Encounter: Payer: Self-pay | Admitting: Nurse Practitioner

## 2021-12-11 ENCOUNTER — Other Ambulatory Visit: Payer: Self-pay

## 2021-12-11 ENCOUNTER — Ambulatory Visit (INDEPENDENT_AMBULATORY_CARE_PROVIDER_SITE_OTHER): Payer: BC Managed Care – PPO | Admitting: Nurse Practitioner

## 2021-12-11 VITALS — BP 117/76 | HR 80 | Resp 14 | Ht 65.75 in | Wt 213.0 lb

## 2021-12-11 DIAGNOSIS — N393 Stress incontinence (female) (male): Secondary | ICD-10-CM | POA: Diagnosis not present

## 2021-12-11 DIAGNOSIS — F419 Anxiety disorder, unspecified: Secondary | ICD-10-CM | POA: Diagnosis not present

## 2021-12-11 DIAGNOSIS — F3289 Other specified depressive episodes: Secondary | ICD-10-CM

## 2021-12-11 DIAGNOSIS — Z79890 Hormone replacement therapy (postmenopausal): Secondary | ICD-10-CM | POA: Diagnosis not present

## 2021-12-11 DIAGNOSIS — Z780 Asymptomatic menopausal state: Secondary | ICD-10-CM

## 2021-12-11 DIAGNOSIS — E785 Hyperlipidemia, unspecified: Secondary | ICD-10-CM

## 2021-12-11 DIAGNOSIS — Z833 Family history of diabetes mellitus: Secondary | ICD-10-CM

## 2021-12-11 DIAGNOSIS — Z01419 Encounter for gynecological examination (general) (routine) without abnormal findings: Secondary | ICD-10-CM | POA: Diagnosis not present

## 2021-12-11 MED ORDER — ESCITALOPRAM OXALATE 10 MG PO TABS: ORAL_TABLET | ORAL | 3 refills | Status: DC

## 2021-12-11 MED ORDER — ESTRADIOL 0.5 MG PO TABS: ORAL_TABLET | ORAL | 3 refills | Status: DC

## 2021-12-11 NOTE — Progress Notes (Signed)
Angie [name] [birth year] [id number]   History:  55 y.o. G2P1011 presents for annual exam. Postmenopausal - on ERT. Tried to wean Estradiol but began having anxiety, which was the reason she was started on it initially. Two weeks after restarting her anxiety subsided. S/P 2007 TAH RSO for endometrioma. Normal pap and mammogram history. She complains of mild stress incontinence. She has had it for years and does not feel it has gotten any worse.   Gynecologic History No LMP recorded. Patient has had a hysterectomy.   Contraception: status post hysterectomy Sexually active: Yes  Health maintenance Last Pap: 09/24/2011. Results were: Normal Last mammogram: 12/03/2021. Results were: Asymmetry in left breast, F/U tomorrow Last colonoscopy: 12/05/2018. Results were: Normal, 10-year recall Last Dexa: Not indicated  Past medical history, past surgical history, family history and social history were all reviewed and documented in the EPIC chart. 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   ROS:  A ROS was performed and pertinent positives and negatives are included.  Exam:  Vitals:   12/11/21 1552  BP: 117/76  Pulse: 80  Resp: 14  Weight: 213 lb (96.6 kg)  Height: 5' 5.75" (1.67 m)    Body mass index is 34.64 kg/m.  General appearance:  Normal Thyroid:  Symmetrical, normal in size, without palpable masses or nodularity. Respiratory  Auscultation:  Clear without wheezing or rhonchi Cardiovascular  Auscultation:  Regular rate, without rubs, murmurs or gallops  Edema/varicosities:  Not grossly evident Abdominal  Soft,nontender, without masses, guarding or rebound.  Liver/spleen:  No organomegaly noted  Hernia:  None appreciated  Skin  Inspection:  Grossly normal   Breasts: Examined lying and sitting.   Right: Without masses, retractions, discharge or axillary adenopathy.   Left: Without masses,  retractions, discharge or axillary adenopathy. Genitourinary   Inguinal/mons:  Normal without inguinal adenopathy  External genitalia:  Normal appearing vulva with no masses, tenderness, or lesions  BUS/Urethra/Skene's glands:  Normal  Vagina:  Normal appearing with normal color and discharge, no lesions  Cervix:  Absent  Uterus:  Absent  Adnexa/parametria:     Rt: Normal in size, without masses or tenderness.   Lt: Normal in size, without masses or tenderness.  Anus and perineum: Normal  Digital rectal exam: Normal sphincter tone without palpated masses or tenderness  Patient informed chaperone available to be present for breast and pelvic exam. Patient has requested no chaperone to be present. Patient has been advised what will be completed during breast and pelvic exam.   Assessment/Plan:  55 y.o. G2P1011 for annual exam.   Well female exam with routine gynecological exam - Plan: CBC with Differential/Platelet, Comprehensive metabolic panel. Education provided on SBEs, importance of preventative screenings, current guidelines, high calcium diet, regular exercise, and multivitamin daily. Will return fasting for labs.  Postmenopausal - On HRT. S/P 2007 TAH RSO for endometrioma.  Hormone replacement therapy (HRT) - Plan: estradiol (ESTRACE) 0.5 MG tablet daily. Tried to wean Estradiol but began having anxiety, which was the reason she was started on it initially. Two weeks after restarting her anxiety subsided. She is aware of risks for blood clots, heart attack, stroke, and breast cancer with use. She wants to continue. Refill x 1 year provided.   Stress incontinence - This is not new for her and has been ongoing for years.  We discussed causes and lifestyle changes for management. She plans to work on [employer]  loss and just bought a rower for at home and will do at-home pelvic floor strengthening exercises. We did briefly discuss pelvic floor PT if no improvement.  Anxiety - Plan:  escitalopram (LEXAPRO) 10 MG tablet daily. Wants to continue. Refill x 1 year provided.  Family history of diabetes mellitus in father - Plan: Hemoglobin A1c  Hyperlipidemia, unspecified hyperlipidemia type - Plan: Lipid panel  Screening for cervical cancer - Normal pap history. No longer screening per guidelines.    Screening for breast cancer - Asymmetry in left breast on most recent mammogram 12/03/2021. Has follow up tomorrow.    Screening for colon cancer - 2018 colonoscopy. Will repeat at 10-year interval per GI's recommendation.   Screening for osteoporosis - Average risk. Will plan for DXA at age 2.   Return in 1 year for annual.     Olivia Mackie [facility], 4:10 PM 12/11/2021

## 2021-12-12 ENCOUNTER — Other Ambulatory Visit: Payer: BC Managed Care – PPO

## 2021-12-12 DIAGNOSIS — E785 Hyperlipidemia, unspecified: Secondary | ICD-10-CM

## 2021-12-12 DIAGNOSIS — Z833 Family history of diabetes mellitus: Secondary | ICD-10-CM

## 2021-12-12 DIAGNOSIS — Z01419 Encounter for gynecological examination (general) (routine) without abnormal findings: Secondary | ICD-10-CM

## 2021-12-13 LAB — COMPREHENSIVE METABOLIC PANEL
AG Ratio: 1.9 (calc) (ref 1.0–2.5)
ALT: 17 U/L (ref 6–29)
AST: 15 U/L (ref 10–35)
Albumin: 4.3 g/dL (ref 3.6–5.1)
Alkaline phosphatase (APISO): 50 U/L (ref 37–153)
BUN: 18 mg/dL (ref 7–25)
CO2: 29 mmol/L (ref 20–32)
Calcium: 9.5 mg/dL (ref 8.6–10.4)
Chloride: 105 mmol/L (ref 98–110)
Creat: 0.64 mg/dL (ref 0.50–1.03)
Globulin: 2.3 g/dL (calc) (ref 1.9–3.7)
Glucose, Bld: 107 mg/dL — ABNORMAL HIGH (ref 65–99)
Potassium: 5 mmol/L (ref 3.5–5.3)
Sodium: 141 mmol/L (ref 135–146)
Total Bilirubin: 0.5 mg/dL (ref 0.2–1.2)
Total Protein: 6.6 g/dL (ref 6.1–8.1)

## 2021-12-13 LAB — CBC WITH DIFFERENTIAL/PLATELET
Absolute Monocytes: 415 cells/uL (ref 200–950)
Basophils Absolute: 19 cells/uL (ref 0–200)
Basophils Relative: 0.3 %
Eosinophils Absolute: 143 cells/uL (ref 15–500)
Eosinophils Relative: 2.3 %
HCT: 42.6 % (ref 35.0–45.0)
Hemoglobin: 14.4 g/dL (ref 11.7–15.5)
Lymphs Abs: 2319 cells/uL (ref 850–3900)
MCH: 32.1 pg (ref 27.0–33.0)
MCHC: 33.8 g/dL (ref 32.0–36.0)
MCV: 94.9 fL (ref 80.0–100.0)
MPV: 11.3 fL (ref 7.5–12.5)
Monocytes Relative: 6.7 %
Neutro Abs: 3305 cells/uL (ref 1500–7800)
Neutrophils Relative %: 53.3 %
Platelets: 237 10*3/uL (ref 140–400)
RBC: 4.49 10*6/uL (ref 3.80–5.10)
RDW: 11.8 % (ref 11.0–15.0)
Total Lymphocyte: 37.4 %
WBC: 6.2 10*3/uL (ref 3.8–10.8)

## 2021-12-13 LAB — LIPID PANEL
Cholesterol: 218 mg/dL — ABNORMAL HIGH (ref <200)
HDL: 67 mg/dL (ref >=50)
LDL Cholesterol (Calc): 122 mg/dL (calc) — ABNORMAL HIGH
Non-HDL Cholesterol (Calc): 151 mg/dL (calc) — ABNORMAL HIGH (ref <130)
Total CHOL/HDL Ratio: 3.3 (calc) (ref <5.0)
Triglycerides: 174 mg/dL — ABNORMAL HIGH (ref <150)

## 2021-12-13 LAB — HEMOGLOBIN A1C
Hgb A1c MFr Bld: 5.5 % of total Hgb (ref <5.7)
Mean Plasma Glucose: 111 mg/dL
eAG (mmol/L): 6.2 mmol/L

## 2021-12-15 ENCOUNTER — Encounter: Payer: Self-pay | Admitting: Nurse Practitioner

## 2022-02-06 ENCOUNTER — Other Ambulatory Visit: Payer: Self-pay | Admitting: Nurse Practitioner

## 2022-02-06 DIAGNOSIS — Z79890 Hormone replacement therapy (postmenopausal): Secondary | ICD-10-CM

## 2022-02-06 DIAGNOSIS — F419 Anxiety disorder, unspecified: Secondary | ICD-10-CM

## 2023-01-13 ENCOUNTER — Encounter: Payer: Self-pay | Admitting: Nurse Practitioner

## 2023-01-28 ENCOUNTER — Other Ambulatory Visit: Payer: Self-pay

## 2023-01-28 DIAGNOSIS — Z79890 Hormone replacement therapy (postmenopausal): Secondary | ICD-10-CM

## 2023-01-28 DIAGNOSIS — F419 Anxiety disorder, unspecified: Secondary | ICD-10-CM

## 2023-01-28 MED ORDER — ESTRADIOL 0.5 MG PO TABS: ORAL_TABLET | ORAL | 0 refills | Status: DC

## 2023-01-28 MED ORDER — ESCITALOPRAM OXALATE 10 MG PO TABS: ORAL_TABLET | ORAL | 0 refills | Status: DC

## 2023-01-28 NOTE — Telephone Encounter (Signed)
RF received for Estradiol 0.5mg  #90.  AEX 12/11/21.  AEX scheduled 02/01/23.  RF sent for #30.

## 2023-01-28 NOTE — Telephone Encounter (Signed)
RF received for escitalopram 10mg .  Last AEX 12/11/21.  AEX is scheduled 02/01/23.  RF sent for #30.

## 2023-02-01 ENCOUNTER — Encounter: Payer: Self-pay | Admitting: Nurse Practitioner

## 2023-02-01 ENCOUNTER — Ambulatory Visit (INDEPENDENT_AMBULATORY_CARE_PROVIDER_SITE_OTHER): Payer: BC Managed Care – PPO | Admitting: Nurse Practitioner

## 2023-02-01 VITALS — BP 120/80 | HR 82 | Ht 66.0 in | Wt 211.0 lb

## 2023-02-01 DIAGNOSIS — E785 Hyperlipidemia, unspecified: Secondary | ICD-10-CM

## 2023-02-01 DIAGNOSIS — F419 Anxiety disorder, unspecified: Secondary | ICD-10-CM

## 2023-02-01 DIAGNOSIS — Z01419 Encounter for gynecological examination (general) (routine) without abnormal findings: Secondary | ICD-10-CM | POA: Diagnosis not present

## 2023-02-01 DIAGNOSIS — Z833 Family history of diabetes mellitus: Secondary | ICD-10-CM

## 2023-02-01 DIAGNOSIS — Z79890 Hormone replacement therapy (postmenopausal): Secondary | ICD-10-CM | POA: Diagnosis not present

## 2023-02-01 MED ORDER — ESCITALOPRAM OXALATE 10 MG PO TABS: 10.0000 mg | ORAL_TABLET | Freq: Every day | ORAL | 3 refills | Status: DC

## 2023-02-01 MED ORDER — ESTRADIOL 0.5 MG PO TABS: 0.5000 mg | ORAL_TABLET | Freq: Every day | ORAL | 3 refills | Status: DC

## 2023-02-01 NOTE — Progress Notes (Signed)
Angie [name] 09-Jul-1966 [id number]   History:  57 y.o. G2P1011 presents for annual exam. Postmenopausal - on ERT. Tried to wean last year but did not tolerate due to worsening anxiety. S/P 2007 TAH RSO for endometrioma. Normal pap and mammogram history. Doing well on Lexapro. Has been struggling with weight gain despite following whole 30 diet. Has been less active due to arthritis.   Gynecologic History No LMP recorded. Patient has had a hysterectomy.   Contraception: status post hysterectomy Sexually active: Yes  Health maintenance Last Pap: 09/24/2011. Results were: Normal Last mammogram: 01/11/2023. Results were: Normal Last colonoscopy: 11/28/2018. Results were: Normal, 10-year recall Last Dexa: Not indicated  Past medical history, past surgical history, family history and social history were all reviewed and documented in the EPIC chart. 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   ROS:  A ROS was performed and pertinent positives and negatives are included.  Exam:  Vitals:   02/01/23 0851  BP: 120/80  Pulse: 82  Weight: 211 lb (95.7 kg)  Height: 5\' 6"  (1.676 m)     Body mass index is 34.06 kg/m.  General appearance:  Normal Thyroid:  Symmetrical, normal in size, without palpable masses or nodularity. Respiratory  Auscultation:  Clear without wheezing or rhonchi Cardiovascular  Auscultation:  Regular rate, without rubs, murmurs or gallops  Edema/varicosities:  Not grossly evident Abdominal  Soft,nontender, without masses, guarding or rebound.  Liver/spleen:  No organomegaly noted  Hernia:  None appreciated  Skin  Inspection:  Grossly normal Breasts: Examined lying and sitting.   Right: Without masses, retractions, discharge or axillary adenopathy.   Left: Without masses, retractions, discharge or axillary adenopathy. Genitourinary   Inguinal/mons:  Normal without  inguinal adenopathy  External genitalia:  Normal appearing vulva with no masses, tenderness, or lesions  BUS/Urethra/Skene's glands:  Normal  Vagina:  Normal appearing with normal color and discharge, no lesions  Cervix:  Absent  Uterus:  Absent  Adnexa/parametria:     Rt: Normal in size, without masses or tenderness.   Lt: Normal in size, without masses or tenderness.  Anus and perineum: Normal  Digital rectal exam: Deferred  Patient informed chaperone available to be present for breast and pelvic exam. Patient has requested no chaperone to be present. Patient has been advised what will be completed during breast and pelvic exam.   Assessment/Plan:  57 y.o. G2P1011 for annual exam.   Well female exam with routine gynecological exam - Plan: CBC with Differential/Platelet, Comprehensive metabolic panel. Education provided on SBEs, importance of preventative screenings, current guidelines, high calcium diet, regular exercise, and multivitamin daily.    Hyperlipidemia, unspecified hyperlipidemia type - Plan: Lipid panel  Postmenopausal hormone therapy - Plan: estradiol (ESTRACE) 0.5 MG tablet daily. Tried to wean last year but did not tolerate due to worsening anxiety. She is aware of risks for blood clots, heart attack, stroke, and breast cancer with use. She wants to continue. Refill x 1 year provided.   Family history of diabetes mellitus in father - Plan: Hemoglobin A1c  Postmenopausal - On ERT. S/P 2007 TAH RSO for endometrioma.  Anxiety - Plan: escitalopram (LEXAPRO) 10 MG tablet daily. Wants to continue. Refill x 1 year provided.  Screening for cervical cancer - Normal pap history. No longer screening per guidelines.    Screening for breast cancer - UTD on mammogram. Continue annual screening. Normal breast exam today.  Screening for colon cancer - 2019 colonoscopy. Will repeat at 10-year interval per GI's recommendation.   Screening for osteoporosis - Average risk. Will plan  for DXA at age 56.   Return in 1 year for annual.     Tamela Gammon [facility], 9:06 AM 02/01/2023

## 2023-02-02 LAB — CBC WITH DIFFERENTIAL/PLATELET
Absolute Monocytes: 442 cells/uL (ref 200–950)
Basophils Absolute: 27 cells/uL (ref 0–200)
Basophils Relative: 0.4 %
Eosinophils Absolute: 150 cells/uL (ref 15–500)
Eosinophils Relative: 2.2 %
HCT: 39.7 % (ref 35.0–45.0)
Hemoglobin: 14 g/dL (ref 11.7–15.5)
Lymphs Abs: 2224 cells/uL (ref 850–3900)
MCH: 32.8 pg (ref 27.0–33.0)
MCHC: 35.3 g/dL (ref 32.0–36.0)
MCV: 93 fL (ref 80.0–100.0)
MPV: 11.1 fL (ref 7.5–12.5)
Monocytes Relative: 6.5 %
Neutro Abs: 3958 cells/uL (ref 1500–7800)
Neutrophils Relative %: 58.2 %
Platelets: 225 10*3/uL (ref 140–400)
RBC: 4.27 10*6/uL (ref 3.80–5.10)
RDW: 11.9 % (ref 11.0–15.0)
Total Lymphocyte: 32.7 %
WBC: 6.8 10*3/uL (ref 3.8–10.8)

## 2023-02-02 LAB — LIPID PANEL
Cholesterol: 162 mg/dL (ref <200)
HDL: 64 mg/dL (ref >=50)
LDL Cholesterol (Calc): 73 mg/dL (calc)
Non-HDL Cholesterol (Calc): 98 mg/dL (calc) (ref <130)
Total CHOL/HDL Ratio: 2.5 (calc) (ref <5.0)
Triglycerides: 171 mg/dL — ABNORMAL HIGH (ref <150)

## 2023-02-02 LAB — COMPREHENSIVE METABOLIC PANEL
AG Ratio: 1.6 (calc) (ref 1.0–2.5)
ALT: 14 U/L (ref 6–29)
AST: 16 U/L (ref 10–35)
Albumin: 4.1 g/dL (ref 3.6–5.1)
Alkaline phosphatase (APISO): 48 U/L (ref 37–153)
BUN: 16 mg/dL (ref 7–25)
CO2: 26 mmol/L (ref 20–32)
Calcium: 9.4 mg/dL (ref 8.6–10.4)
Chloride: 106 mmol/L (ref 98–110)
Creat: 0.72 mg/dL (ref 0.50–1.03)
Globulin: 2.6 g/dL (calc) (ref 1.9–3.7)
Glucose, Bld: 102 mg/dL — ABNORMAL HIGH (ref 65–99)
Potassium: 4.3 mmol/L (ref 3.5–5.3)
Sodium: 140 mmol/L (ref 135–146)
Total Bilirubin: 0.5 mg/dL (ref 0.2–1.2)
Total Protein: 6.7 g/dL (ref 6.1–8.1)

## 2023-02-02 LAB — HEMOGLOBIN A1C
Hgb A1c MFr Bld: 5.5 % of total Hgb (ref <5.7)
Mean Plasma Glucose: 111 mg/dL
eAG (mmol/L): 6.2 mmol/L

## 2023-05-13 ENCOUNTER — Other Ambulatory Visit: Payer: Self-pay | Admitting: Chiropractic Medicine

## 2023-05-13 ENCOUNTER — Ambulatory Visit (INDEPENDENT_AMBULATORY_CARE_PROVIDER_SITE_OTHER): Payer: BLUE CROSS/BLUE SHIELD

## 2023-05-13 DIAGNOSIS — G8929 Other chronic pain: Secondary | ICD-10-CM

## 2023-05-13 DIAGNOSIS — M542 Cervicalgia: Secondary | ICD-10-CM

## 2023-05-13 DIAGNOSIS — M549 Dorsalgia, unspecified: Secondary | ICD-10-CM

## 2023-07-06 ENCOUNTER — Other Ambulatory Visit: Payer: Self-pay | Admitting: Orthopaedic Surgery

## 2023-07-06 MED ORDER — MELOXICAM 15 MG PO TABS: ORAL_TABLET | ORAL | 1 refills | Status: DC

## 2023-08-11 ENCOUNTER — Other Ambulatory Visit: Payer: Self-pay | Admitting: Orthopaedic Surgery

## 2024-01-20 ENCOUNTER — Encounter: Payer: Self-pay | Admitting: Nurse Practitioner

## 2024-03-16 ENCOUNTER — Other Ambulatory Visit: Payer: Self-pay

## 2024-03-16 DIAGNOSIS — F419 Anxiety disorder, unspecified: Secondary | ICD-10-CM

## 2024-03-16 DIAGNOSIS — Z79890 Hormone replacement therapy (postmenopausal): Secondary | ICD-10-CM

## 2024-03-16 MED ORDER — ESCITALOPRAM OXALATE 10 MG PO TABS: 10.0000 mg | ORAL_TABLET | Freq: Every day | ORAL | 0 refills | Status: DC

## 2024-03-16 MED ORDER — ESTRADIOL 0.5 MG PO TABS
0.5000 mg | ORAL_TABLET | Freq: Every day | ORAL | 0 refills | Status: DC
Start: 2024-03-16 — End: 2024-03-20

## 2024-03-16 NOTE — Telephone Encounter (Signed)
 Med refill request: escitalopram 10 mg Last AEX: 02/01/23 Next AEX: 03/20/24 Last MMG (if hormonal med) n/a Refill authorized: escitalopram 10 mg #30 Please approve or deny as appropriate.

## 2024-03-16 NOTE — Telephone Encounter (Signed)
 Med refill request: estradiol 0.5 mg Last AEX: 02/01/23 Next AEX: 03/20/34 Last MMG (if hormonal med) 01/19/24 BI-RADS 1 negative Refill authorized: estradiol 0.5 mg #30 Please approve or deny as appropriate.

## 2024-03-20 ENCOUNTER — Encounter: Payer: Self-pay | Admitting: Nurse Practitioner

## 2024-03-20 ENCOUNTER — Ambulatory Visit (INDEPENDENT_AMBULATORY_CARE_PROVIDER_SITE_OTHER): Payer: BC Managed Care – PPO | Admitting: Nurse Practitioner

## 2024-03-20 VITALS — BP 134/72 | HR 57 | Ht 66.5 in | Wt 205.0 lb

## 2024-03-20 DIAGNOSIS — Z01419 Encounter for gynecological examination (general) (routine) without abnormal findings: Secondary | ICD-10-CM | POA: Diagnosis not present

## 2024-03-20 DIAGNOSIS — F419 Anxiety disorder, unspecified: Secondary | ICD-10-CM

## 2024-03-20 DIAGNOSIS — Z1331 Encounter for screening for depression: Secondary | ICD-10-CM

## 2024-03-20 DIAGNOSIS — Z79890 Hormone replacement therapy (postmenopausal): Secondary | ICD-10-CM | POA: Diagnosis not present

## 2024-03-20 DIAGNOSIS — Z13220 Encounter for screening for lipoid disorders: Secondary | ICD-10-CM

## 2024-03-20 DIAGNOSIS — Z833 Family history of diabetes mellitus: Secondary | ICD-10-CM

## 2024-03-20 MED ORDER — ESCITALOPRAM OXALATE 10 MG PO TABS: 10.0000 mg | ORAL_TABLET | Freq: Every day | ORAL | 3 refills | Status: DC

## 2024-03-20 MED ORDER — ESTRADIOL 0.0375 MG/24HR TD PTTW: 1.0000 | MEDICATED_PATCH | TRANSDERMAL | 1 refills | Status: DC

## 2024-03-20 NOTE — Progress Notes (Addendum)
 Angie [name] Jan 08, 1966 [id number]   History:  58 y.o. G2P1011 presents for annual exam. Postmenopausal - on ERT. Has tried to wean but does not tolerate due to worsening anxiety. S/P 2007 TAH RSO for endometrioma. Normal pap and mammogram history. Doing well on Lexapro.   Gynecologic History No LMP recorded. Patient has had a hysterectomy.   Contraception: status post hysterectomy Sexually active: Yes  Health maintenance Last Pap: 09/24/2011. Results were: Normal Last mammogram: 01/19/2024. Results were: Normal Last colonoscopy: 11/28/2018. Results were: Normal, 10-year recall Last Dexa: Not indicated     03/20/2024    7:52 AM  Depression screen PHQ 2/9  Decreased Interest 0  Down, Depressed, Hopeless 0  PHQ - 2 Score 0     Past medical history, past surgical history, family history and social history were all reviewed and documented in the EPIC chart. 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   ROS:  A ROS was performed and pertinent positives and negatives are included.  Exam:  Vitals:   03/20/24 0748  BP: 134/72  Pulse: (!) 57  SpO2: 98%  Weight: 205 lb (93 kg)  Height: 5' 6.5" (1.689 m)    Body mass index is 32.59 kg/m.  General appearance:  Normal Thyroid:  Symmetrical, normal in size, without palpable masses or nodularity. Respiratory  Auscultation:  Clear without wheezing or rhonchi Cardiovascular  Auscultation:  Regular rate, without rubs, murmurs or gallops  Edema/varicosities:  Not grossly evident Abdominal  Soft,nontender, without masses, guarding or rebound.  Liver/spleen:  No organomegaly noted  Hernia:  None appreciated  Skin  Inspection:  Grossly normal Breasts: Examined lying and sitting.   Right: Without masses, retractions, discharge or axillary adenopathy.   Left: Without masses, retractions, discharge or axillary adenopathy. Pelvic: External  genitalia:  no lesions              Urethra:  normal appearing urethra with no masses, tenderness or lesions              Bartholins and Skenes: normal                 Vagina: normal appearing vagina with normal color and discharge, no lesions              Cervix: absent Bimanual Exam:  Uterus:  absent              Adnexa: no mass, fullness, tenderness              Rectovaginal: Deferred              Anus:  normal, no lesions  Patient informed chaperone available to be present for breast and pelvic exam. Patient has requested no chaperone to be present. Patient has been advised what will be completed during breast and pelvic exam.   Assessment/Plan:  58 y.o. G2P1011 for annual exam.   Well female exam with routine gynecological exam - Plan: CBC with Differential/Platelet, Comprehensive metabolic panel. Education provided on SBEs, importance of preventative screenings, current guidelines, high calcium diet, regular exercise, and multivitamin daily.    Postmenopausal hormone therapy - Plan: estradiol (VIVELLE-DOT) 0.0375 MG/24HR twice weekly. Has tried to wean but does not tolerate due to worsening anxiety. Discussed safety profile of patch versus pill. Tried patch 20 years ago and it did not stick well. Wants to try again.   Screening for lipid disorders -  Plan: Lipid panel. H/O elevated triglycerides, genetic. Taking fish oil.  Family history of diabetes mellitus in father - Plan: Hemoglobin A1c  Postmenopausal - On ERT. S/P 2007 TAH RSO for endometrioma.  Anxiety - Plan: escitalopram (LEXAPRO) 10 MG tablet daily. Wants to continue. Refill x 1 year provided.  Screening for cervical cancer - Normal pap history. No longer screening per guidelines.    Screening for breast cancer - UTD on mammogram. Continue annual screening. Normal breast exam today.   Screening for colon cancer - 2019 colonoscopy. Will repeat at 10-year interval per GI's recommendation.   Screening for osteoporosis -  Average risk. Will plan for DXA at age 49.   Return in about 1 year (around 03/20/2025) for Annual.    Olivia Mackie [facility], 8:45 AM 03/20/2024

## 2024-03-21 ENCOUNTER — Other Ambulatory Visit: Payer: Self-pay | Admitting: Nurse Practitioner

## 2024-03-21 ENCOUNTER — Encounter: Payer: Self-pay | Admitting: Nurse Practitioner

## 2024-03-21 DIAGNOSIS — E785 Hyperlipidemia, unspecified: Secondary | ICD-10-CM

## 2024-03-21 LAB — CBC WITH DIFFERENTIAL/PLATELET
Absolute Lymphocytes: 2040 {cells}/uL (ref 850–3900)
Absolute Monocytes: 459 {cells}/uL (ref 200–950)
Basophils Absolute: 31 {cells}/uL (ref 0–200)
Basophils Relative: 0.5 %
Eosinophils Absolute: 143 {cells}/uL (ref 15–500)
Eosinophils Relative: 2.3 %
HCT: 40.5 % (ref 35.0–45.0)
Hemoglobin: 14 g/dL (ref 11.7–15.5)
MCH: 32.3 pg (ref 27.0–33.0)
MCHC: 34.6 g/dL (ref 32.0–36.0)
MCV: 93.3 fL (ref 80.0–100.0)
MPV: 10.5 fL (ref 7.5–12.5)
Monocytes Relative: 7.4 %
Neutro Abs: 3528 {cells}/uL (ref 1500–7800)
Neutrophils Relative %: 56.9 %
Platelets: 243 10*3/uL (ref 140–400)
RBC: 4.34 10*6/uL (ref 3.80–5.10)
RDW: 11.9 % (ref 11.0–15.0)
Total Lymphocyte: 32.9 %
WBC: 6.2 10*3/uL (ref 3.8–10.8)

## 2024-03-21 LAB — HEMOGLOBIN A1C
Hgb A1c MFr Bld: 5.3 %{Hb} (ref <5.7)
Mean Plasma Glucose: 105 mg/dL
eAG (mmol/L): 5.8 mmol/L

## 2024-03-21 LAB — COMPREHENSIVE METABOLIC PANEL
AG Ratio: 2 (calc) (ref 1.0–2.5)
ALT: 14 U/L (ref 6–29)
AST: 14 U/L (ref 10–35)
Albumin: 4.3 g/dL (ref 3.6–5.1)
Alkaline phosphatase (APISO): 45 U/L (ref 37–153)
BUN: 20 mg/dL (ref 7–25)
CO2: 24 mmol/L (ref 20–32)
Calcium: 9.2 mg/dL (ref 8.6–10.4)
Chloride: 106 mmol/L (ref 98–110)
Creat: 0.66 mg/dL (ref 0.50–1.03)
Globulin: 2.2 g/dL (ref 1.9–3.7)
Glucose, Bld: 98 mg/dL (ref 65–99)
Potassium: 4.5 mmol/L (ref 3.5–5.3)
Sodium: 137 mmol/L (ref 135–146)
Total Bilirubin: 0.4 mg/dL (ref 0.2–1.2)
Total Protein: 6.5 g/dL (ref 6.1–8.1)
eGFR: 101 mL/min/{1.73_m2} (ref >=60)

## 2024-03-21 LAB — LIPID PANEL
Cholesterol: 214 mg/dL — ABNORMAL HIGH (ref <200)
HDL: 65 mg/dL (ref >=50)
LDL Cholesterol (Calc): 122 mg/dL — ABNORMAL HIGH
Non-HDL Cholesterol (Calc): 149 mg/dL — ABNORMAL HIGH (ref <130)
Total CHOL/HDL Ratio: 3.3 (calc) (ref <5.0)
Triglycerides: 156 mg/dL — ABNORMAL HIGH (ref <150)

## 2024-04-14 ENCOUNTER — Other Ambulatory Visit: Payer: Self-pay | Admitting: Nurse Practitioner

## 2024-04-14 DIAGNOSIS — Z79890 Hormone replacement therapy (postmenopausal): Secondary | ICD-10-CM

## 2024-04-14 DIAGNOSIS — F419 Anxiety disorder, unspecified: Secondary | ICD-10-CM

## 2024-04-17 ENCOUNTER — Other Ambulatory Visit: Payer: Self-pay

## 2024-04-17 DIAGNOSIS — Z79890 Hormone replacement therapy (postmenopausal): Secondary | ICD-10-CM

## 2024-04-17 MED ORDER — ESTRADIOL 0.0375 MG/24HR TD PTTW: 1.0000 | MEDICATED_PATCH | TRANSDERMAL | 1 refills | Status: DC

## 2024-04-17 NOTE — Telephone Encounter (Signed)
 Med refill request: escitalopram  Last AEX: 03/20/24 TW Next AEX:not scheduled Last MMG (if hormonal med) 01/19/24 Refill authorized:Last Rx sent #90 with 3 refills on 02/27/24. Please approve or deny.  Estradiol  was already sent to pharmacy today.

## 2024-04-17 NOTE — Telephone Encounter (Signed)
 Received prescription refill request via fax from [facility] [address] [place] today:  Med refill request:estradiol  0.5 mg tablets Last AEX: 03/20/24 Next AEX: not scheduled Last MMG (if hormonal med) 01/19/24 Refill authorized: Last Rx sent #8 patch with 1 refill on 03/20/24.  Please approve or deny as appropriate.

## 2024-05-18 ENCOUNTER — Other Ambulatory Visit: Payer: Self-pay | Admitting: Nurse Practitioner

## 2024-05-18 DIAGNOSIS — Z79890 Hormone replacement therapy (postmenopausal): Secondary | ICD-10-CM

## 2024-05-18 NOTE — Telephone Encounter (Signed)
 Med refill request: estradiol  0.0375 mg patch Last AEX: 03/20/24 Next AEX: n/a Last MMG (if hormonal med) n/a Refill duplicate.  Sent to [facility] earlier today.

## 2024-05-18 NOTE — Telephone Encounter (Signed)
 Spoke to patient, patient states she did not pick up the oral estrogen she is only using the estradiol  patch.

## 2024-05-18 NOTE — Telephone Encounter (Signed)
 Patient has recent refills for both oral and patch. Please see which one she is doing. We switched to patch in March.

## 2024-05-18 NOTE — Telephone Encounter (Signed)
 Med refill request: estradiol  0.0375 mg patch Last AEX: 03/20/24 Next AEX: none scheduled Last MMG (if hormonal med) 01/19/24 BI-RADS 1 negative Last RX 04/17/24 #8 with #1 RF. Refill authorized: Please approve or deny as appropriate.

## 2024-05-24 ENCOUNTER — Other Ambulatory Visit: Payer: Self-pay | Admitting: Physician Assistant

## 2024-07-09 ENCOUNTER — Other Ambulatory Visit: Payer: Self-pay | Admitting: Physician Assistant

## 2024-07-13 ENCOUNTER — Encounter: Payer: Self-pay | Admitting: Nurse Practitioner

## 2024-07-13 ENCOUNTER — Other Ambulatory Visit: Payer: Self-pay | Admitting: Nurse Practitioner

## 2024-07-13 DIAGNOSIS — E785 Hyperlipidemia, unspecified: Secondary | ICD-10-CM

## 2024-07-13 NOTE — Telephone Encounter (Signed)
 Spoke with patient. Apologized for inconvenience. Option provided for labs at [facility] or returning to [facility]. Patient is going to return to [facility] this morning.   New future lab order placed for Lipid panel, with [facility] as facility. Order released. Provided my direct number if any issues when she returns to [facility], call while there. Patient agreeable and appreciative of call.   [facility] Lipid panel order cancelled.   Routing to provider for final review. Patient is agreeable to disposition. Will close encounter.

## 2024-07-13 NOTE — Telephone Encounter (Addendum)
 Patient is at [facility], lab still unable to be seen by [facility]. Copy of Lab requisition faxed to [phone] and confirmed.   This message was sent via FAXCOM, a product from [employer]. [email or web address]                    -------Fax Transmission Report-------  To:               Recipient at [phone] Subject:          SECURE Result:           The transmission was successful. Explanation:      All Pages Ok Pages Sent:       3 Connect Time:     0 minutes, 53 seconds Transmit Time:    07/13/2024 11:09 Transfer Rate:    14400 Status Code:      0000 Retry Count:      0 Job Id:           [id number] Unique Id:        [id number] Fax Line:         57 Fax Server:       [id number]

## 2024-07-14 LAB — LIPID PANEL
Chol/HDL Ratio: 3.3 ratio (ref 0.0–4.4)
Cholesterol, Total: 220 mg/dL — ABNORMAL HIGH (ref 100–199)
HDL: 66 mg/dL (ref >39)
LDL Chol Calc (NIH): 135 mg/dL — ABNORMAL HIGH (ref 0–99)
Triglycerides: 109 mg/dL (ref 0–149)
VLDL Cholesterol Cal: 19 mg/dL (ref 5–40)

## 2024-07-17 ENCOUNTER — Ambulatory Visit: Payer: Self-pay | Admitting: Nurse Practitioner

## 2024-07-26 ENCOUNTER — Ambulatory Visit (INDEPENDENT_AMBULATORY_CARE_PROVIDER_SITE_OTHER): Admitting: Orthopedic Surgery

## 2024-07-26 ENCOUNTER — Other Ambulatory Visit (INDEPENDENT_AMBULATORY_CARE_PROVIDER_SITE_OTHER): Payer: Self-pay

## 2024-07-26 ENCOUNTER — Other Ambulatory Visit: Payer: Self-pay

## 2024-07-26 DIAGNOSIS — M19011 Primary osteoarthritis, right shoulder: Secondary | ICD-10-CM | POA: Diagnosis not present

## 2024-07-26 DIAGNOSIS — M7521 Bicipital tendinitis, right shoulder: Secondary | ICD-10-CM

## 2024-07-26 DIAGNOSIS — M25511 Pain in right shoulder: Secondary | ICD-10-CM

## 2024-07-26 NOTE — Progress Notes (Unsigned)
 Office Visit Note   Patient: Angie [name]           Date of Birth: Jan 24, 1966           MRN: [id number] Visit Date: 07/26/2024 Requested by: No referring provider defined for this encounter. PCP: Patient, No Pcp Per  Subjective: Chief Complaint  Patient presents with  . Right Shoulder - Pain    HPI: Angie [name] is a 58 y.o. female who presents to the office reporting ***.                ROS: All systems reviewed are negative as they relate to the chief complaint within the history of present illness.  Patient denies fevers or chills.  Assessment & Plan: Visit Diagnoses:  1. Right shoulder pain, unspecified chronicity     Plan: ***  Follow-Up Instructions: No follow-ups on file.   Orders:  Orders Placed This Encounter  Procedures  . XR Shoulder Right  . US  Guided Needle Placement - No Linked Charges   No orders of the defined types were placed in this encounter.     Procedures: Large Joint Inj: R glenohumeral on 07/26/2024 5:47 PM Indications: diagnostic evaluation and pain Details: 22 G 3.5 in needle, ultrasound-guided posterior approach  Arthrogram: No  Medications: 9 mL bupivacaine 0.5 %; 5 mL lidocaine 1 %; 40 mg triamcinolone acetonide 40 MG/ML Outcome: tolerated well, no immediate complications Procedure, treatment alternatives, risks and benefits explained, specific risks discussed. Consent was given by the patient. Immediately prior to procedure a time out was called to verify the correct patient, procedure, equipment, support staff and site/side marked as required. Patient was prepped and draped in the usual sterile fashion.    Medium Joint Inj: R acromioclavicular on 07/26/2024 5:48 PM Indications: pain and diagnostic evaluation Details: 25 G 1.5 in needle, ultrasound-guided superior approach Medications: 0.66 mL bupivacaine 0.25 %; 3 mL lidocaine 1 %; 10 mg triamcinolone acetonide 40 MG/ML Outcome: tolerated well, no immediate  complications Procedure, treatment alternatives, risks and benefits explained, specific risks discussed. Consent was given by the patient. Immediately prior to procedure a time out was called to verify the correct patient, procedure, equipment, support staff and site/side marked as required. Patient was prepped and draped in the usual sterile fashion.       Clinical Data: No additional findings.  Objective: Vital Signs: There were no vitals taken for this visit.  Physical Exam:  Constitutional: Patient appears well-developed HEENT:  Head: Normocephalic Eyes:EOM are normal Neck: Normal range of motion Cardiovascular: Normal rate Pulmonary/chest: Effort normal Neurologic: Patient is alert Skin: Skin is warm Psychiatric: Patient has normal mood and affect  Ortho Exam: ***  Specialty Comments:  No specialty comments available.  Imaging: No results found.   PMFS History: Patient Active Problem List   Diagnosis Date Noted  . History of total abdominal hysterectomy 10/28/2020  . RUQ pain 10/24/2018  . Obesity (BMI 30-39.9) 10/24/2018  . Hypercholesteremia 09/28/2012  . Depression    Past Medical History:  Diagnosis Date  . Arthritis   . Depression     Family History  Problem Relation Age of Onset  . Diabetes Father   . Hypertension Maternal Aunt   . Heart disease Maternal Aunt   . Hypertension Maternal Grandmother   . Heart disease Maternal Grandmother   . Hyperlipidemia Mother   . Colon cancer Neg Hx   . Esophageal cancer Neg Hx   . Rectal cancer Neg Hx   . Stomach cancer  Neg Hx     Past Surgical History:  Procedure Laterality Date  . ABDOMINAL HYSTERECTOMY  2007   TAH,.RSO  . APPENDECTOMY  1986  . BREAST BIOPSY Right 01/2014   benign  . CARPAL TUNNEL RELEASE  1997  . CHOLECYSTECTOMY  2006  . EXPLORATORY LAP-TAH,RSO  2007   ENDOMETRIOMA  . OOPHORECTOMY     RSO  . RESECTOSCOPIC POLYPECTOMY/BTL  10/28/05  . SEPTOPLASTY  1990  . TUBAL LIGATION   10/28/05   Social History   Occupational History  . Not on file  Tobacco Use  . Smoking status: Never  . Smokeless tobacco: Never  Vaping Use  . Vaping status: Never Used  Substance and Sexual Activity  . Alcohol use: No  . Drug use: No  . Sexual activity: Yes    Birth control/protection: Surgical, None    Comment: HYSTERECTOMY

## 2024-07-28 ENCOUNTER — Encounter: Payer: Self-pay | Admitting: Orthopedic Surgery

## 2024-07-28 MED ORDER — BUPIVACAINE HCL 0.25 % IJ SOLN
0.6600 mL | INTRAMUSCULAR | Status: AC | PRN
  Administered 2024-07-26: .66 mL via INTRA_ARTICULAR

## 2024-07-28 MED ORDER — TRIAMCINOLONE ACETONIDE 40 MG/ML IJ SUSP
10.0000 mg | INTRAMUSCULAR | Status: AC | PRN
  Administered 2024-07-26: 10 mg via INTRA_ARTICULAR

## 2024-07-28 MED ORDER — LIDOCAINE HCL 1 % IJ SOLN
3.0000 mL | INTRAMUSCULAR | Status: AC | PRN
  Administered 2024-07-26: 3 mL

## 2024-07-28 MED ORDER — LIDOCAINE HCL 1 % IJ SOLN
5.0000 mL | INTRAMUSCULAR | Status: AC | PRN
  Administered 2024-07-26: 5 mL

## 2024-07-28 MED ORDER — BUPIVACAINE HCL 0.5 % IJ SOLN
9.0000 mL | INTRAMUSCULAR | Status: AC | PRN
  Administered 2024-07-26: 9 mL via INTRA_ARTICULAR

## 2024-07-28 MED ORDER — TRIAMCINOLONE ACETONIDE 40 MG/ML IJ SUSP
40.0000 mg | INTRAMUSCULAR | Status: AC | PRN
  Administered 2024-07-26: 40 mg via INTRA_ARTICULAR

## 2024-10-30 ENCOUNTER — Encounter: Payer: Self-pay | Admitting: Radiology

## 2024-12-23 ENCOUNTER — Other Ambulatory Visit: Payer: Self-pay | Admitting: Orthopaedic Surgery

## 2025-01-19 LAB — HM MAMMOGRAPHY

## 2025-01-23 ENCOUNTER — Encounter: Payer: Self-pay | Admitting: Nurse Practitioner
